# Patient Record
Sex: Male | Born: 1950
Health system: Southern US, Community
[De-identification: ages and names within clinical notes are randomized; demographics above are authoritative.]

## PROBLEM LIST (undated history)

## (undated) DIAGNOSIS — E785 Hyperlipidemia, unspecified: Secondary | ICD-10-CM

## (undated) DIAGNOSIS — K219 Gastro-esophageal reflux disease without esophagitis: Secondary | ICD-10-CM

## (undated) DIAGNOSIS — E119 Type 2 diabetes mellitus without complications: Secondary | ICD-10-CM

## (undated) DIAGNOSIS — G8929 Other chronic pain: Secondary | ICD-10-CM

## (undated) DIAGNOSIS — M48061 Spinal stenosis, lumbar region without neurogenic claudication: Secondary | ICD-10-CM

## (undated) DIAGNOSIS — I10 Essential (primary) hypertension: Secondary | ICD-10-CM

## (undated) DIAGNOSIS — M549 Dorsalgia, unspecified: Secondary | ICD-10-CM

## (undated) DIAGNOSIS — Z973 Presence of spectacles and contact lenses: Secondary | ICD-10-CM

## (undated) DIAGNOSIS — N433 Hydrocele, unspecified: Secondary | ICD-10-CM

## (undated) HISTORY — PX: EYE SURGERY: SHX253

## (undated) HISTORY — PX: BACK SURGERY: SHX140

---

## 1992-12-14 HISTORY — PX: CERVICAL FUSION: SHX112

## 2002-12-14 HISTORY — PX: WRIST GANGLION EXCISION: SUR520

## 2005-01-19 ENCOUNTER — Ambulatory Visit: Payer: Self-pay | Admitting: Family Medicine

## 2005-01-28 ENCOUNTER — Ambulatory Visit: Payer: Self-pay | Admitting: Family Medicine

## 2005-06-26 ENCOUNTER — Ambulatory Visit: Payer: Self-pay | Admitting: Family Medicine

## 2005-07-31 ENCOUNTER — Ambulatory Visit: Payer: Self-pay | Admitting: Family Medicine

## 2005-10-30 ENCOUNTER — Ambulatory Visit: Payer: Self-pay | Admitting: Family Medicine

## 2013-11-20 ENCOUNTER — Other Ambulatory Visit: Payer: Self-pay | Admitting: Urology

## 2013-11-30 ENCOUNTER — Encounter (HOSPITAL_BASED_OUTPATIENT_CLINIC_OR_DEPARTMENT_OTHER): Payer: Self-pay | Admitting: *Deleted

## 2013-12-04 ENCOUNTER — Encounter (HOSPITAL_BASED_OUTPATIENT_CLINIC_OR_DEPARTMENT_OTHER): Payer: Self-pay | Admitting: *Deleted

## 2013-12-04 NOTE — Progress Notes (Signed)
NPO AFTER MN.  ARRIVE AT 0830.  NEEDS ISTAT AND EKG.   

## 2013-12-04 NOTE — Progress Notes (Signed)
12/04/13 0941  OBSTRUCTIVE SLEEP APNEA  Have you ever been diagnosed with sleep apnea through a sleep study? No  Do you snore loudly (loud enough to be heard through closed doors)?  1  Do you often feel tired, fatigued, or sleepy during the daytime? 0  Has anyone observed you stop breathing during your sleep? 0  Do you have, or are you being treated for high blood pressure? 1  BMI more than 35 kg/m2? 0  Age over 64 years old? 1  Neck circumference greater than 40 cm/18 inches? 0  Gender: 1  Obstructive Sleep Apnea Score 4  Score 4 or greater  Results sent to PCP

## 2013-12-11 ENCOUNTER — Encounter (HOSPITAL_BASED_OUTPATIENT_CLINIC_OR_DEPARTMENT_OTHER)
Admission: RE | Disposition: A | Discharge status: Home / Self Care | Payer: Self-pay | Source: Ambulatory Visit | Attending: Urology

## 2013-12-11 ENCOUNTER — Encounter (HOSPITAL_BASED_OUTPATIENT_CLINIC_OR_DEPARTMENT_OTHER): Payer: Self-pay | Admitting: Anesthesiology

## 2013-12-11 ENCOUNTER — Encounter (HOSPITAL_BASED_OUTPATIENT_CLINIC_OR_DEPARTMENT_OTHER): Payer: BC Managed Care – PPO | Admitting: Anesthesiology

## 2013-12-11 ENCOUNTER — Other Ambulatory Visit: Payer: Self-pay

## 2013-12-11 ENCOUNTER — Ambulatory Visit (HOSPITAL_BASED_OUTPATIENT_CLINIC_OR_DEPARTMENT_OTHER): Payer: BC Managed Care – PPO | Admitting: Anesthesiology

## 2013-12-11 ENCOUNTER — Ambulatory Visit (HOSPITAL_BASED_OUTPATIENT_CLINIC_OR_DEPARTMENT_OTHER)
Admission: RE | Admit: 2013-12-11 | Discharge: 2013-12-11 | Disposition: A | Discharge status: Home / Self Care | Payer: BC Managed Care – PPO | Source: Ambulatory Visit | Attending: Urology | Admitting: Urology

## 2013-12-11 DIAGNOSIS — E7800 Pure hypercholesterolemia, unspecified: Secondary | ICD-10-CM | POA: Insufficient documentation

## 2013-12-11 DIAGNOSIS — E669 Obesity, unspecified: Secondary | ICD-10-CM | POA: Insufficient documentation

## 2013-12-11 DIAGNOSIS — K219 Gastro-esophageal reflux disease without esophagitis: Secondary | ICD-10-CM | POA: Insufficient documentation

## 2013-12-11 DIAGNOSIS — Z87891 Personal history of nicotine dependence: Secondary | ICD-10-CM | POA: Insufficient documentation

## 2013-12-11 DIAGNOSIS — N433 Hydrocele, unspecified: Secondary | ICD-10-CM | POA: Diagnosis present

## 2013-12-11 DIAGNOSIS — Z79899 Other long term (current) drug therapy: Secondary | ICD-10-CM | POA: Insufficient documentation

## 2013-12-11 DIAGNOSIS — I10 Essential (primary) hypertension: Secondary | ICD-10-CM | POA: Insufficient documentation

## 2013-12-11 HISTORY — DX: Hydrocele, unspecified: N43.3

## 2013-12-11 HISTORY — DX: Other chronic pain: G89.29

## 2013-12-11 HISTORY — DX: Hyperlipidemia, unspecified: E78.5

## 2013-12-11 HISTORY — DX: Gastro-esophageal reflux disease without esophagitis: K21.9

## 2013-12-11 HISTORY — DX: Dorsalgia, unspecified: M54.9

## 2013-12-11 HISTORY — DX: Essential (primary) hypertension: I10

## 2013-12-11 HISTORY — DX: Spinal stenosis, lumbar region without neurogenic claudication: M48.061

## 2013-12-11 HISTORY — PX: HYDROCELE EXCISION: SHX482

## 2013-12-11 HISTORY — DX: Presence of spectacles and contact lenses: Z97.3

## 2013-12-11 LAB — POCT I-STAT 4, (NA,K, GLUC, HGB,HCT)
Glucose, Bld: 120 mg/dL — ABNORMAL HIGH (ref 70–99)
HCT: 49 % (ref 39.0–52.0)
Hemoglobin: 16.7 g/dL (ref 13.0–17.0)
Potassium: 4.3 mEq/L (ref 3.5–5.1)
Sodium: 140 mEq/L (ref 135–145)

## 2013-12-11 SURGERY — HYDROCELECTOMY
Anesthesia: General | Site: Scrotum | Laterality: Left

## 2013-12-11 MED ORDER — LIDOCAINE HCL (CARDIAC) 20 MG/ML IV SOLN
INTRAVENOUS | Status: DC | PRN
  Administered 2013-12-11: 60 mg via INTRAVENOUS

## 2013-12-11 MED ORDER — OXYCODONE-ACETAMINOPHEN 5-325 MG PO TABS
ORAL_TABLET | ORAL | Status: AC
  Filled 2013-12-11: qty 1

## 2013-12-11 MED ORDER — CEFAZOLIN SODIUM-DEXTROSE 2-3 GM-% IV SOLR
2.0000 g | INTRAVENOUS | Status: AC
  Administered 2013-12-11: 2 g via INTRAVENOUS
  Filled 2013-12-11: qty 50

## 2013-12-11 MED ORDER — PROMETHAZINE HCL 25 MG/ML IJ SOLN
6.2500 mg | INTRAMUSCULAR | Status: DC | PRN
  Filled 2013-12-11: qty 1

## 2013-12-11 MED ORDER — DEXAMETHASONE SODIUM PHOSPHATE 4 MG/ML IJ SOLN
INTRAMUSCULAR | Status: DC | PRN
  Administered 2013-12-11: 10 mg via INTRAVENOUS

## 2013-12-11 MED ORDER — EPHEDRINE SULFATE 50 MG/ML IJ SOLN
INTRAMUSCULAR | Status: DC | PRN
  Administered 2013-12-11 (×2): 10 mg via INTRAVENOUS

## 2013-12-11 MED ORDER — FENTANYL CITRATE 0.05 MG/ML IJ SOLN
INTRAMUSCULAR | Status: DC | PRN
  Administered 2013-12-11 (×2): 50 ug via INTRAVENOUS

## 2013-12-11 MED ORDER — MIDAZOLAM HCL 5 MG/5ML IJ SOLN
INTRAMUSCULAR | Status: DC | PRN
  Administered 2013-12-11: 2 mg via INTRAVENOUS

## 2013-12-11 MED ORDER — MIDAZOLAM HCL 2 MG/2ML IJ SOLN
INTRAMUSCULAR | Status: AC
  Filled 2013-12-11: qty 2

## 2013-12-11 MED ORDER — LACTATED RINGERS IV SOLN
INTRAVENOUS | Status: DC
  Administered 2013-12-11 (×2): via INTRAVENOUS
  Filled 2013-12-11: qty 1000

## 2013-12-11 MED ORDER — OXYCODONE-ACETAMINOPHEN 5-325 MG PO TABS
1.0000 | ORAL_TABLET | ORAL | Status: DC | PRN
  Administered 2013-12-11: 1 via ORAL
  Filled 2013-12-11: qty 1

## 2013-12-11 MED ORDER — FENTANYL CITRATE 0.05 MG/ML IJ SOLN
25.0000 ug | INTRAMUSCULAR | Status: DC | PRN
  Filled 2013-12-11: qty 1

## 2013-12-11 MED ORDER — FENTANYL CITRATE 0.05 MG/ML IJ SOLN
INTRAMUSCULAR | Status: AC
  Filled 2013-12-11: qty 4

## 2013-12-11 MED ORDER — BUPIVACAINE-EPINEPHRINE 0.5% -1:200000 IJ SOLN
INTRAMUSCULAR | Status: DC | PRN
  Administered 2013-12-11: 18 mL

## 2013-12-11 MED ORDER — PROPOFOL 10 MG/ML IV BOLUS
INTRAVENOUS | Status: DC | PRN
  Administered 2013-12-11: 200 mg via INTRAVENOUS

## 2013-12-11 MED ORDER — ONDANSETRON HCL 4 MG/2ML IJ SOLN
INTRAMUSCULAR | Status: DC | PRN
  Administered 2013-12-11: 4 mg via INTRAVENOUS

## 2013-12-11 MED ORDER — OXYCODONE-ACETAMINOPHEN 10-325 MG PO TABS: 1.0000 | ORAL_TABLET | ORAL | Status: DC | PRN

## 2013-12-11 SURGICAL SUPPLY — 29 items
BANDAGE GAUZE ELAST BULKY 4 IN (GAUZE/BANDAGES/DRESSINGS) ×2 IMPLANT
BLADE SURG 15 STRL LF DISP TIS (BLADE) ×1 IMPLANT
BLADE SURG 15 STRL SS (BLADE) ×1
BLADE SURG ROTATE 9660 (MISCELLANEOUS) ×2 IMPLANT
BNDG GAUZE ELAST 4 BULKY (GAUZE/BANDAGES/DRESSINGS) ×2 IMPLANT
CANISTER SUCTION 2500CC (MISCELLANEOUS) ×2 IMPLANT
CLOTH BEACON ORANGE TIMEOUT ST (SAFETY) ×2 IMPLANT
COVER MAYO STAND STRL (DRAPES) ×2 IMPLANT
COVER TABLE BACK 60X90 (DRAPES) ×2 IMPLANT
DRAPE PED LAPAROTOMY (DRAPES) ×2 IMPLANT
ELECT REM PT RETURN 9FT ADLT (ELECTROSURGICAL) ×2
ELECTRODE REM PT RTRN 9FT ADLT (ELECTROSURGICAL) ×1 IMPLANT
GLOVE BIO SURGEON STRL SZ 6.5 (GLOVE) ×2 IMPLANT
GLOVE BIO SURGEON STRL SZ8 (GLOVE) ×2 IMPLANT
GLOVE INDICATOR 6.5 STRL GRN (GLOVE) ×4 IMPLANT
GOWN PREVENTION PLUS LG XLONG (DISPOSABLE) ×2 IMPLANT
GOWN STRL REIN XL XLG (GOWN DISPOSABLE) ×2 IMPLANT
NEEDLE HYPO 25X1 1.5 SAFETY (NEEDLE) ×2 IMPLANT
NS IRRIG 500ML POUR BTL (IV SOLUTION) ×4 IMPLANT
PACK BASIN DAY SURGERY FS (CUSTOM PROCEDURE TRAY) ×2 IMPLANT
PENCIL BUTTON HOLSTER BLD 10FT (ELECTRODE) ×2 IMPLANT
SPONGE GAUZE 4X4 12PLY (GAUZE/BANDAGES/DRESSINGS) ×2 IMPLANT
SUPPORT SCROTAL LG STRP (MISCELLANEOUS) ×2 IMPLANT
SUT CHROMIC 3 0 SH 27 (SUTURE) ×6 IMPLANT
SYR CONTROL 10ML LL (SYRINGE) ×2 IMPLANT
TOWEL OR 17X24 6PK STRL BLUE (TOWEL DISPOSABLE) ×2 IMPLANT
TRAY DSU PREP LF (CUSTOM PROCEDURE TRAY) ×2 IMPLANT
TUBE CONNECTING 12X1/4 (SUCTIONS) ×2 IMPLANT
YANKAUER SUCT BULB TIP NO VENT (SUCTIONS) ×2 IMPLANT

## 2013-12-11 NOTE — H&P (Signed)
History of Present Illness Left hydrocele: For about 6 months now he's had progressive swelling of his left hemiscrotum. 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 He denies any scrotal/testicular injury, history of infection or prior inguinal surgery. He underwent a scrotal ultrasound on 11/06/13 which revealed a large left hydrocele but both testicles and epididymis were noted to be normal. He saw a urologist who recommended surgery. His primary care physician told him that it could be aspirated. He has come to see me regarding this and it is of moderate severity with no modifying factors or other associated signs and symptoms.   Past Medical History Problems  1. History of hypercholesterolemia (V12.29) 2. History of hypertension (V12.59) 3. History of Reflux (530.81)  Surgical History Problems  1. History of Back Surgery  Current Meds 1. Advil TABS;  Therapy: (Recorded:05Dec2014) to Recorded 2. Antacid CHEW;  Therapy: (Recorded:05Dec2014) to Recorded 3. Atorvastatin Calcium 40 MG Oral Tablet;  Therapy: (Recorded:05Dec2014) to Recorded 4. Benadryl Allergy SYRP;  Therapy: (Recorded:05Dec2014) to Recorded 5. Lisinopril 10 MG Oral Tablet;  Therapy: (Recorded:05Dec2014) to Recorded  Allergies Medication  1. Bactrim TABS 2. Doxycycline Hyclate CAPS 3. Vancomycin HCl SOLR  Family History Problems  1. 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  Genitourinary: nocturia and scrotal swelling.  Musculoskeletal: back pain.     Vitals Vital Signs Height: 6 ft  Weight: 230 lb  BMI Calculated: 31.19 BSA Calculated: 2.26 Blood Pressure: 126 / 73 Heart Rate: 65  Physical Exam Constitutional: Well nourished and well developed . No acute distress.  ENT:. The ears and nose are normal in appearance.  Neck: The appearance of the neck is normal and no neck mass is present.  Pulmonary: No respiratory distress and normal respiratory rhythm and effort.  Cardiovascular: Heart rate and rhythm are normal . No peripheral edema.  Abdomen: The abdomen is soft and nontender. No masses are palpated. No CVA tenderness. No hernias are palpable. No hepatosplenomegaly noted.  Genitourinary: Examination of the penis demonstrates no discharge, no masses, no lesions and a normal meatus. The scrotum is without lesions. Examination of the left scrotum demostrates a hydrocele. The right epididymis is palpably normal and non-tender. The left epididymis is palpably normal and non-tender. The right testis is non-tender and without masses. The left testis is nonpalpable.  Lymphatics: The femoral and inguinal nodes are not enlarged or tender.  Skin: Normal skin turgor, no visible rash and no visible skin lesions.  Neuro/Psych:. Mood and affect are appropriate.    Results/Data Urine COLOR YELLOW   APPEARANCE CLEAR   SPECIFIC GRAVITY 1.025   pH 6.0   GLUCOSE NEG mg/dL  BILIRUBIN NEG   KETONE NEG mg/dL  BLOOD NEG   PROTEIN NEG mg/dL  UROBILINOGEN 0.2 mg/dL  NITRITE NEG   LEUKOCYTE ESTERASE NEG    Assessment   He has a moderately large left hydrocele. We therefore discussed the treatment options. I told him that it certainly could be aspirated but this is not a permanent solution as the fluid would almost certainly recur. I therefore have discussed surgical management with a hydrocelectomy.  I went over the procedure with him in detail. We discussed the incision used, the risks and complications, the outpatient nature of the procedure, the  probability of success and the anticipated postoperative course. He understands, has had all of his questions answered to his satisfaction and has elected to proceed.   Plan   He is scheduled for a left hydrocelectomy.

## 2013-12-11 NOTE — Anesthesia Postprocedure Evaluation (Signed)
  Anesthesia Post-op Note  Patient: Xavier Willis  Procedure(s) Performed: Procedure(s) (LRB): LEFT HYDROCELECTOMY ADULT (Left)  Patient Location: PACU  Anesthesia Type: General  Level of Consciousness: awake and alert   Airway and Oxygen Therapy: Patient Spontanous Breathing  Post-op Pain: mild  Post-op Assessment: Post-op Vital signs reviewed, Patient's Cardiovascular Status Stable, Respiratory Function Stable, Patent Airway and No signs of Nausea or vomiting  Last Vitals:  Filed Vitals:   12/11/13 1145  BP: 123/77  Pulse: 88  Temp:   Resp: 18    Post-op Vital Signs: stable   Complications: No apparent anesthesia complications

## 2013-12-11 NOTE — Transfer of Care (Signed)
Immediate Anesthesia Transfer of Care Note  Patient: Xavier Willis  Procedure(s) Performed: Procedure(s) (LRB): LEFT HYDROCELECTOMY ADULT (Left)  Patient Location: PACU  Anesthesia Type: General  Level of Consciousness: awake, alert  and oriented  Airway & Oxygen Therapy: Patient Spontanous Breathing and Patient connected to face mask oxygen  Post-op Assessment: Report given to PACU RN and Post -op Vital signs reviewed and stable  Post vital signs: Reviewed and stable  Complications: No apparent anesthesia complications

## 2013-12-11 NOTE — Anesthesia Preprocedure Evaluation (Addendum)
Anesthesia Evaluation  Patient identified by MRN, date of birth, ID band Patient awake    Reviewed: Allergy & Precautions, H&P , NPO status , Patient's Chart, lab work & pertinent test results  Airway Mallampati: II TM Distance: >3 FB Neck ROM: Full   Comment: Some decreased neck extension after neck fusion. Dental no notable dental hx.    Pulmonary neg pulmonary ROS,  breath sounds clear to auscultation  Pulmonary exam normal       Cardiovascular Exercise Tolerance: Good hypertension, Pt. on medications Rhythm:Regular Rate:Normal  ECG: Normal.   Neuro/Psych negative neurological ROS  negative psych ROS   GI/Hepatic Neg liver ROS, GERD-  Medicated,  Endo/Other  negative endocrine ROS  Renal/GU negative Renal ROS  negative genitourinary   Musculoskeletal negative musculoskeletal ROS (+)   Abdominal (+) + obese,   Peds negative pediatric ROS (+)  Hematology negative hematology ROS (+)   Anesthesia Other Findings   Reproductive/Obstetrics negative OB ROS                         Anesthesia Physical Anesthesia Plan  ASA: II  Anesthesia Plan: General   Post-op Pain Management:    Induction: Intravenous  Airway Management Planned: LMA  Additional Equipment:   Intra-op Plan:   Post-operative Plan: Extubation in OR  Informed Consent: I have reviewed the patients History and Physical, chart, labs and discussed the procedure including the risks, benefits and alternatives for the proposed anesthesia with the patient or authorized representative who has indicated his/her understanding and acceptance.   Dental advisory given  Plan Discussed with: CRNA  Anesthesia Plan Comments:         Anesthesia Quick Evaluation

## 2013-12-11 NOTE — Op Note (Signed)
PATIENT:  Xavier Willis  PRE-OPERATIVE DIAGNOSIS: Left hydrocele  POST-OPERATIVE DIAGNOSIS:  Same  PROCEDURE:  Procedure(s): Left hydrocelectomy  SURGEON:  Garnett Farm  INDICATION: Mr. Xavier Willis is a 62 year old male who has been experiencing progressive swelling of his left hemiscrotum over the past 6 months. 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 He's not had any injury or history of infection. He's had no prior inguinal surgery. A scrotal ultrasound in 11/14 revealed a large left hydrocele with a normal testicle. There was an indication that there was a very small right hydrocele present as well. This was not clinically evident. We discussed the treatment options and elected to proceed with surgical therapy.  ANESTHESIA:  General  EBL:  Minimal  DRAINS: None  LOCAL MEDICATIONS USED:  1/2% Marcaine with epinephrine 18 cc  SPECIMEN:  None  DISPOSITION OF SPECIMEN:  N/A  Description of procedure: After informed consent the patient was brought to the major OR, placed on the table and administered general anesthesia. His genitalia was then sterilely prepped and draped. An official timeout was then performed.  I injected Marcaine with epinephrine in the midline and allowed adequate time for epinephrine effect. A midline median raphae scrotal incision was then made and carried down over the left hydrocele. The tissue over the hydrocele was cleared using a combination of sharp and blunt technique. The hydrocele was then opened, drained of clear amber fluid and delivered through the incision. The excess hydrocele sac tissue was excised with the Bovie electrocautery and then I cauterized the edges. I then reapproximated the edges posteriorly behind the epididymis with a running, locking 3-0 chromic suture. The appendix testis was removed with the Bovie.   His testicle was then replaced in the normal anatomic position and his left hemiscrotum. I evaluated the  right testicle through the incision and found essentially no hydrocele fluid surrounding the testicle. I then closed the deep scrotal tissue with running 3-0 chromic suture in a locking fashion. I closed the skin with running 3-0 chromic. Neosporin, a sterile gauze dressing, fluff Kerlix and a scrotal support were applied. The patient tolerated the procedure well no intraoperative complications. Needle sponge and instrument counts were correct at the end of the operation.   PLAN OF CARE: Discharge to home after PACU  PATIENT DISPOSITION:  PACU - hemodynamically stable.

## 2013-12-12 ENCOUNTER — Encounter (HOSPITAL_BASED_OUTPATIENT_CLINIC_OR_DEPARTMENT_OTHER): Payer: Self-pay | Admitting: Urology

## 2018-03-16 DIAGNOSIS — R351 Nocturia: Secondary | ICD-10-CM | POA: Diagnosis not present

## 2018-03-16 DIAGNOSIS — N401 Enlarged prostate with lower urinary tract symptoms: Secondary | ICD-10-CM | POA: Diagnosis not present

## 2018-03-16 DIAGNOSIS — Z125 Encounter for screening for malignant neoplasm of prostate: Secondary | ICD-10-CM | POA: Diagnosis not present

## 2018-07-13 DIAGNOSIS — I10 Essential (primary) hypertension: Secondary | ICD-10-CM | POA: Diagnosis not present

## 2018-07-13 DIAGNOSIS — E8881 Metabolic syndrome: Secondary | ICD-10-CM | POA: Diagnosis not present

## 2018-09-15 DIAGNOSIS — L703 Acne tropica: Secondary | ICD-10-CM | POA: Diagnosis not present

## 2018-11-14 DIAGNOSIS — L299 Pruritus, unspecified: Secondary | ICD-10-CM | POA: Diagnosis not present

## 2018-11-14 DIAGNOSIS — L703 Acne tropica: Secondary | ICD-10-CM | POA: Diagnosis not present

## 2018-11-14 DIAGNOSIS — L209 Atopic dermatitis, unspecified: Secondary | ICD-10-CM | POA: Diagnosis not present

## 2018-11-14 DIAGNOSIS — C4441 Basal cell carcinoma of skin of scalp and neck: Secondary | ICD-10-CM | POA: Diagnosis not present

## 2018-12-13 DIAGNOSIS — R05 Cough: Secondary | ICD-10-CM | POA: Diagnosis not present

## 2018-12-13 DIAGNOSIS — J069 Acute upper respiratory infection, unspecified: Secondary | ICD-10-CM | POA: Diagnosis not present

## 2018-12-16 DIAGNOSIS — J329 Chronic sinusitis, unspecified: Secondary | ICD-10-CM | POA: Diagnosis not present

## 2018-12-16 DIAGNOSIS — Z6831 Body mass index (BMI) 31.0-31.9, adult: Secondary | ICD-10-CM | POA: Diagnosis not present

## 2019-01-27 DIAGNOSIS — Z6831 Body mass index (BMI) 31.0-31.9, adult: Secondary | ICD-10-CM | POA: Diagnosis not present

## 2019-01-27 DIAGNOSIS — Z0000 Encounter for general adult medical examination without abnormal findings: Secondary | ICD-10-CM | POA: Diagnosis not present

## 2019-01-27 DIAGNOSIS — Z79899 Other long term (current) drug therapy: Secondary | ICD-10-CM | POA: Diagnosis not present

## 2019-01-27 DIAGNOSIS — E785 Hyperlipidemia, unspecified: Secondary | ICD-10-CM | POA: Diagnosis not present

## 2019-01-27 DIAGNOSIS — R7309 Other abnormal glucose: Secondary | ICD-10-CM | POA: Diagnosis not present

## 2019-01-27 DIAGNOSIS — Z1339 Encounter for screening examination for other mental health and behavioral disorders: Secondary | ICD-10-CM | POA: Diagnosis not present

## 2019-01-27 DIAGNOSIS — Z1331 Encounter for screening for depression: Secondary | ICD-10-CM | POA: Diagnosis not present

## 2019-01-27 DIAGNOSIS — N400 Enlarged prostate without lower urinary tract symptoms: Secondary | ICD-10-CM | POA: Diagnosis not present

## 2019-01-27 DIAGNOSIS — I10 Essential (primary) hypertension: Secondary | ICD-10-CM | POA: Diagnosis not present

## 2019-01-27 DIAGNOSIS — Z2821 Immunization not carried out because of patient refusal: Secondary | ICD-10-CM | POA: Diagnosis not present

## 2019-05-05 DIAGNOSIS — L820 Inflamed seborrheic keratosis: Secondary | ICD-10-CM | POA: Diagnosis not present

## 2019-05-16 DIAGNOSIS — E1165 Type 2 diabetes mellitus with hyperglycemia: Secondary | ICD-10-CM | POA: Diagnosis not present

## 2019-05-16 DIAGNOSIS — Z6831 Body mass index (BMI) 31.0-31.9, adult: Secondary | ICD-10-CM | POA: Diagnosis not present

## 2019-07-20 DIAGNOSIS — R1900 Intra-abdominal and pelvic swelling, mass and lump, unspecified site: Secondary | ICD-10-CM | POA: Diagnosis not present

## 2019-07-20 DIAGNOSIS — Z6831 Body mass index (BMI) 31.0-31.9, adult: Secondary | ICD-10-CM | POA: Diagnosis not present

## 2019-07-20 DIAGNOSIS — R200 Anesthesia of skin: Secondary | ICD-10-CM | POA: Diagnosis not present

## 2019-08-07 DIAGNOSIS — R200 Anesthesia of skin: Secondary | ICD-10-CM | POA: Diagnosis not present

## 2019-08-07 DIAGNOSIS — M545 Low back pain: Secondary | ICD-10-CM | POA: Diagnosis not present

## 2019-08-18 DIAGNOSIS — R1900 Intra-abdominal and pelvic swelling, mass and lump, unspecified site: Secondary | ICD-10-CM | POA: Diagnosis not present

## 2019-08-24 DIAGNOSIS — M545 Low back pain: Secondary | ICD-10-CM | POA: Diagnosis not present

## 2019-08-24 DIAGNOSIS — M48062 Spinal stenosis, lumbar region with neurogenic claudication: Secondary | ICD-10-CM | POA: Diagnosis not present

## 2019-08-24 DIAGNOSIS — M4726 Other spondylosis with radiculopathy, lumbar region: Secondary | ICD-10-CM | POA: Diagnosis not present

## 2019-08-24 DIAGNOSIS — M5116 Intervertebral disc disorders with radiculopathy, lumbar region: Secondary | ICD-10-CM | POA: Diagnosis not present

## 2019-08-24 DIAGNOSIS — M961 Postlaminectomy syndrome, not elsewhere classified: Secondary | ICD-10-CM | POA: Diagnosis not present

## 2019-09-26 DIAGNOSIS — M5126 Other intervertebral disc displacement, lumbar region: Secondary | ICD-10-CM | POA: Diagnosis not present

## 2019-09-26 DIAGNOSIS — M48062 Spinal stenosis, lumbar region with neurogenic claudication: Secondary | ICD-10-CM | POA: Diagnosis not present

## 2019-09-26 DIAGNOSIS — Z6825 Body mass index (BMI) 25.0-25.9, adult: Secondary | ICD-10-CM | POA: Diagnosis not present

## 2019-09-26 DIAGNOSIS — R030 Elevated blood-pressure reading, without diagnosis of hypertension: Secondary | ICD-10-CM | POA: Diagnosis not present

## 2019-10-09 DIAGNOSIS — M48061 Spinal stenosis, lumbar region without neurogenic claudication: Secondary | ICD-10-CM | POA: Diagnosis not present

## 2019-10-09 DIAGNOSIS — M5126 Other intervertebral disc displacement, lumbar region: Secondary | ICD-10-CM | POA: Diagnosis not present

## 2019-10-09 DIAGNOSIS — M5116 Intervertebral disc disorders with radiculopathy, lumbar region: Secondary | ICD-10-CM | POA: Diagnosis not present

## 2019-10-09 DIAGNOSIS — M5125 Other intervertebral disc displacement, thoracolumbar region: Secondary | ICD-10-CM | POA: Diagnosis not present

## 2019-10-10 ENCOUNTER — Other Ambulatory Visit: Payer: Self-pay | Admitting: Neurosurgery

## 2019-10-10 DIAGNOSIS — I10 Essential (primary) hypertension: Secondary | ICD-10-CM | POA: Diagnosis not present

## 2019-10-10 DIAGNOSIS — Z6825 Body mass index (BMI) 25.0-25.9, adult: Secondary | ICD-10-CM | POA: Diagnosis not present

## 2019-10-10 DIAGNOSIS — M5126 Other intervertebral disc displacement, lumbar region: Secondary | ICD-10-CM | POA: Diagnosis not present

## 2019-10-20 NOTE — Progress Notes (Signed)
[facility] [id number], [address] AT [place] [id number] [place] [address] Phone: [phone] Fax: [phone]      Your procedure is scheduled on Wednesday 10/25/2019.  Report to [facility] Main Entrance "A" at 06:30 A.M., and check in at the Admitting office.  Call this number if you have problems the morning of surgery:  [phone]  Call [phone] if you have any questions prior to your surgery date Monday-Friday 8am-4pm    Remember:  Do not eat or drink after midnight the night before your surgery     Take these medicines the morning of surgery with A SIP OF WATER: Fexofenadine (Allegra) Tamsulosin (Flomax)  7 days prior to surgery STOP taking any Meloxicam (Mobic); Aspirin (unless otherwise instructed by your surgeon) or Aspirin containing products, Aleve, Naproxen, Ibuprofen, Motrin, Advil, Goody's, BC's, all herbal medications, fish oil, and all vitamins.   WHAT DO I DO ABOUT MY DIABETES MEDICATION?  Marland Kitchen Do not take oral diabetes medicines (pills) the morning of surgery. - Do not take Metformin (Glucophage) the morning of surgery    How to Manage Your Diabetes Before and After Surgery  Why is it important to control my blood sugar before and after surgery? . Improving blood sugar levels before and after surgery helps healing and can limit problems. . A way of improving blood sugar control is eating a healthy diet by: o  Eating less sugar and carbohydrates o  Increasing activity/exercise o  Talking with your doctor about reaching your blood sugar goals . High blood sugars (greater than 180 mg/dL) can raise your risk of infections and slow your recovery, so you will need to focus on controlling your diabetes during the weeks before surgery. . Make sure that the doctor who takes care of your diabetes knows about your planned surgery including the date and location.  How do I manage my blood sugar  before surgery? . Check your blood sugar at least 4 times a day, starting 2 days before surgery, to make sure that the level is not too high or low. . Check your blood sugar the morning of your surgery when you wake up and every 2 hours until you get to the Short Stay unit. o If your blood sugar is less than 70 mg/dL, you will need to treat for low blood sugar: o Do not eat anything. o Do not take insulin. o Treat a low blood sugar (less than 70 mg/dL) with  cup of clear juice (cranberry or apple), 4 glucose tablets, OR glucose gel. o Recheck blood sugar in 15 minutes after treatment (to make sure it is greater than 70 mg/dL). If your blood sugar is not greater than 70 mg/dL on recheck, call [phone] for further instructions. . Report your blood sugar to the short stay nurse when you get to Short Stay.  . If you are admitted to the hospital after surgery: o Your blood sugar will be checked by the staff and you will probably be given insulin after surgery (instead of oral diabetes medicines) to make sure you have good blood sugar levels. o The goal for blood sugar control after surgery is 80-180 mg/dL.     The Morning of Surgery  Do not wear jewelry, make-up or nail polish.  Do not wear lotions, powders, or perfumes/colognes, or deodorant  Do not shave 48 hours prior to surgery.  Men may shave face and neck.  Do not bring valuables to the hospital.  [facility] is not responsible for any belongings or valuables.  If you are a smoker, DO NOT Smoke 24 hours prior to surgery  If you wear a CPAP at night please bring your mask, tubing, and machine the morning of surgery   Remember that you must have someone to transport you home after your surgery, and remain with you for 24 hours if you are discharged the same day.   Contacts, glasses, hearing aids, dentures or bridgework may not be worn into surgery.    Leave your suitcase in the car.  After surgery it may be brought to your  room.  For patients admitted to the hospital, discharge time will be determined by your treatment team.  Patients discharged the day of surgery will not be allowed to drive home.    Special instructions:   [place]- Preparing For Surgery  Before surgery, you can play an important role. Because skin is not sterile, your skin needs to be as free of germs as possible. You can reduce the number of germs on your skin by washing with CHG (chlorahexidine gluconate) Soap before surgery.  CHG is an antiseptic cleaner which kills germs and bonds with the skin to continue killing germs even after washing.    Oral Hygiene is also important to reduce your risk of infection.  Remember - BRUSH YOUR TEETH THE MORNING OF SURGERY WITH YOUR REGULAR TOOTHPASTE  Please do not use if you have an allergy to CHG or antibacterial soaps. If your skin becomes reddened/irritated stop using the CHG.  Do not shave (including legs and underarms) for at least 48 hours prior to first CHG shower. It is OK to shave your face.  Please follow these instructions carefully.   1. Shower the NIGHT BEFORE SURGERY and the MORNING OF SURGERY with CHG Soap.   2. If you chose to wash your hair, wash your hair first as usual with your normal shampoo.  3. After you shampoo, rinse your hair and body thoroughly to remove the shampoo.  4. Use CHG as you would any other liquid soap. You can apply CHG directly to the skin and wash gently with a scrungie or a clean washcloth.   5. Apply the CHG Soap to your body ONLY FROM THE NECK DOWN.  Do not use on open wounds or open sores. Avoid contact with your eyes, ears, mouth and genitals (private parts). Wash Face and genitals (private parts)  with your normal soap.   6. Wash thoroughly, paying special attention to the area where your surgery will be performed.  7. Thoroughly rinse your body with warm water from the neck down.  8. DO NOT shower/wash with your normal soap after using and  rinsing off the CHG Soap.  9. Pat yourself dry with a CLEAN TOWEL.  10. Wear CLEAN PAJAMAS to bed the night before surgery, wear comfortable clothes the morning of surgery  11. Place CLEAN SHEETS on your bed the night of your first shower and DO NOT SLEEP WITH PETS.    Day of Surgery:  Please shower the morning of surgery with the CHG soap Do not apply any deodorants/lotions.  Please wear clean clothes to the hospital/surgery center.   Remember to brush your teeth WITH YOUR REGULAR TOOTHPASTE.   Please read over the following fact sheets that you were given.

## 2019-10-23 ENCOUNTER — Encounter (HOSPITAL_COMMUNITY): Payer: Self-pay

## 2019-10-23 ENCOUNTER — Other Ambulatory Visit: Payer: Self-pay

## 2019-10-23 ENCOUNTER — Encounter (HOSPITAL_COMMUNITY)
Admission: RE | Admit: 2019-10-23 | Discharge: 2019-10-23 | Disposition: A | Discharge status: Home / Self Care | Payer: PPO | Source: Ambulatory Visit | Attending: Neurosurgery | Admitting: Neurosurgery

## 2019-10-23 ENCOUNTER — Other Ambulatory Visit (HOSPITAL_COMMUNITY)
Admission: RE | Admit: 2019-10-23 | Discharge: 2019-10-23 | Disposition: A | Discharge status: Home / Self Care | Payer: PPO | Source: Ambulatory Visit | Attending: Neurosurgery | Admitting: Neurosurgery

## 2019-10-23 DIAGNOSIS — M5126 Other intervertebral disc displacement, lumbar region: Secondary | ICD-10-CM | POA: Diagnosis not present

## 2019-10-23 DIAGNOSIS — M549 Dorsalgia, unspecified: Secondary | ICD-10-CM | POA: Diagnosis not present

## 2019-10-23 DIAGNOSIS — G8929 Other chronic pain: Secondary | ICD-10-CM | POA: Insufficient documentation

## 2019-10-23 DIAGNOSIS — I10 Essential (primary) hypertension: Secondary | ICD-10-CM | POA: Diagnosis not present

## 2019-10-23 DIAGNOSIS — Z981 Arthrodesis status: Secondary | ICD-10-CM | POA: Diagnosis not present

## 2019-10-23 DIAGNOSIS — E119 Type 2 diabetes mellitus without complications: Secondary | ICD-10-CM | POA: Diagnosis not present

## 2019-10-23 DIAGNOSIS — K219 Gastro-esophageal reflux disease without esophagitis: Secondary | ICD-10-CM | POA: Diagnosis not present

## 2019-10-23 DIAGNOSIS — Z7984 Long term (current) use of oral hypoglycemic drugs: Secondary | ICD-10-CM | POA: Insufficient documentation

## 2019-10-23 DIAGNOSIS — Z79899 Other long term (current) drug therapy: Secondary | ICD-10-CM | POA: Insufficient documentation

## 2019-10-23 DIAGNOSIS — Z87891 Personal history of nicotine dependence: Secondary | ICD-10-CM | POA: Diagnosis not present

## 2019-10-23 DIAGNOSIS — Z01818 Encounter for other preprocedural examination: Secondary | ICD-10-CM | POA: Diagnosis not present

## 2019-10-23 HISTORY — DX: Type 2 diabetes mellitus without complications: E11.9

## 2019-10-23 LAB — CBC
HCT: 47.3 % (ref 39.0–52.0)
Hemoglobin: 16 g/dL (ref 13.0–17.0)
MCH: 31.4 pg (ref 26.0–34.0)
MCHC: 33.8 g/dL (ref 30.0–36.0)
MCV: 92.9 fL (ref 80.0–100.0)
Platelets: 198 10*3/uL (ref 150–400)
RBC: 5.09 MIL/uL (ref 4.22–5.81)
RDW: 13 % (ref 11.5–15.5)
WBC: 6.9 10*3/uL (ref 4.0–10.5)
nRBC: 0 % (ref 0.0–0.2)

## 2019-10-23 LAB — BASIC METABOLIC PANEL
Anion gap: 11 (ref 5–15)
BUN: 15 mg/dL (ref 8–23)
CO2: 24 mmol/L (ref 22–32)
Calcium: 9.4 mg/dL (ref 8.9–10.3)
Chloride: 100 mmol/L (ref 98–111)
Creatinine, Ser: 0.95 mg/dL (ref 0.61–1.24)
GFR calc Af Amer: >60 mL/min (ref >60)
GFR calc non Af Amer: >60 mL/min (ref >60)
Glucose, Bld: 293 mg/dL — ABNORMAL HIGH (ref 70–99)
Potassium: 5 mmol/L (ref 3.5–5.1)
Sodium: 135 mmol/L (ref 135–145)

## 2019-10-23 LAB — HEMOGLOBIN A1C
Hgb A1c MFr Bld: 8.2 % — ABNORMAL HIGH (ref 4.8–5.6)
Mean Plasma Glucose: 188.64 mg/dL

## 2019-10-23 LAB — SURGICAL PCR SCREEN
MRSA, PCR: NEGATIVE
Staphylococcus aureus: NEGATIVE

## 2019-10-23 LAB — GLUCOSE, CAPILLARY: Glucose-Capillary: 304 mg/dL — ABNORMAL HIGH (ref 70–99)

## 2019-10-23 LAB — SARS CORONAVIRUS 2 (TAT 6-24 HRS): SARS Coronavirus 2: NEGATIVE

## 2019-10-23 NOTE — Progress Notes (Signed)
PCP:  Dr. Kennith Maes Cardiologist: Denies  EKG:  10/23/19 CXR:  N/A ECHO:  denies Stress Test:  denies Cardiac Cath:  denies  Fasting Blood Sugar-  Pt does not have glucose meter  Checks Blood Sugar__0_ times a day  Patient states has pre-diabetes.  BG at PAT was 304.  Anesthesia review:  Yes, BG 304  Covid testing today 10/23/19  Patient denies shortness of breath, fever, cough, and chest pain at PAT appointment.  Patient verbalized understanding of instructions provided today at the PAT appointment.  Patient asked to review instructions at home and day of surgery.

## 2019-10-24 NOTE — Anesthesia Preprocedure Evaluation (Addendum)
Anesthesia Evaluation  Patient identified by MRN, date of birth, ID band Patient awake    Reviewed: Allergy & Precautions, H&P , NPO status , Patient's Chart, lab work & pertinent test results  Airway Mallampati: III  TM Distance: >3 FB Neck ROM: Full    Dental no notable dental hx. (+) Teeth Intact, Dental Advisory Given   Pulmonary neg pulmonary ROS, former smoker,    Pulmonary exam normal breath sounds clear to auscultation       Cardiovascular Exercise Tolerance: Good hypertension, Pt. on medications  Rhythm:Regular Rate:Normal     Neuro/Psych negative neurological ROS  negative psych ROS   GI/Hepatic Neg liver ROS, GERD  Medicated and Controlled,  Endo/Other  diabetes, Type 2, Oral Hypoglycemic Agents  Renal/GU negative Renal ROS  negative genitourinary   Musculoskeletal   Abdominal   Peds  Hematology negative hematology ROS (+)   Anesthesia Other Findings   Reproductive/Obstetrics negative OB ROS                           Anesthesia Physical Anesthesia Plan  ASA: II  Anesthesia Plan: General   Post-op Pain Management:    Induction: Intravenous  PONV Risk Score and Plan: 3 and Ondansetron, Dexamethasone and Midazolam  Airway Management Planned: Oral ETT  Additional Equipment:   Intra-op Plan:   Post-operative Plan: Extubation in OR  Informed Consent: I have reviewed the patients History and Physical, chart, labs and discussed the procedure including the risks, benefits and alternatives for the proposed anesthesia with the patient or authorized representative who has indicated his/her understanding and acceptance.     Dental advisory given  Plan Discussed with: CRNA  Anesthesia Plan Comments: (PAT note written 10/24/2019 by Myra Gianotti, PA-C. )       Anesthesia Quick Evaluation

## 2019-10-24 NOTE — Progress Notes (Signed)
Anesthesia Chart Review:  Case: [id number] Date/Time: 10/25/19 0815   Procedure: Microdiscectomy - left - L2-L3 - right - L4-L5 redo (Bilateral Back)   Anesthesia type: General   Pre-op diagnosis: HNP   Location: [facility] OR ROOM 18 / [place] OR   Surgeon: Kary Kos, MD      DISCUSSION: Patient is a 68 year old male scheduled for the above procedure.  History includes former smoker, chronic back pain, DM2, GERD, cervical fusion 1994. BMI is consistent with obesity.   Non-fasting CBG 304 and glucose 293 with BMET. He does not check his CBGs at home as he does not have a glucometer. A1c 8.2, consistent with mean plasma glucose of 188.64 which was routed to Dr. Saintclair Halsted.   Preoperative EKG showed NSR. He denied SOB, chest pain, cough, and fever at PAT RN visit. He will get a fasting CBG on arrival, and if results acceptable then I would anticipate that he can proceed as planned. COVID-19 test negative on 10/23/19.    VS: BP 129/79   Pulse 99   Temp 37 C (Oral)   Resp 18   Ht 6' (1.829 m)   Wt 110.4 kg   SpO2 98%   BMI 33.02 kg/m   PROVIDERS: Ronita Hipps, MD is PCP    LABS: Preoperative labs noted. K 5.0, but specimen hemolyzed. A1c 8.2% which was routed to surgeon.  (all labs ordered are listed, but only abnormal results are displayed)  Labs Reviewed  GLUCOSE, CAPILLARY - Abnormal; Notable for the following components:      Result Value   Glucose-Capillary 304 (*)    All other components within normal limits  BASIC METABOLIC PANEL - Abnormal; Notable for the following components:   Glucose, Bld 293 (*)    All other components within normal limits  HEMOGLOBIN A1C - Abnormal; Notable for the following components:   Hgb A1c MFr Bld 8.2 (*)    All other components within normal limits  SURGICAL PCR SCREEN  CBC     EKG: 10/23/19: NSR   CV: N/A   Past Medical History:  Diagnosis Date  . Chronic back pain   . Diabetes mellitus without complication ([place])   . GERD (gastroesophageal  reflux disease)   . Hyperlipidemia   . Hypertension   . Left hydrocele   . Lumbar stenosis   . Wears glasses     Past Surgical History:  Procedure Laterality Date  . CERVICAL FUSION  1994   C6 -- C7  . HYDROCELE EXCISION Left 12/11/2013   Procedure: LEFT HYDROCELECTOMY ADULT;  Surgeon: Claybon Jabs, MD;  Location[facility];  Service: Urology;  Laterality: Left;  . WRIST GANGLION EXCISION Left 2004    MEDICATIONS: . atorvastatin (LIPITOR) 40 MG tablet  . diphenhydrAMINE (BENADRYL) 50 MG capsule  . famotidine (PEPCID) 20 MG tablet  . fexofenadine (ALLEGRA) 180 MG tablet  . ibuprofen (ADVIL) 200 MG tablet  . lisinopril (ZESTRIL) 2.5 MG tablet  . meloxicam (MOBIC) 7.5 MG tablet  . metFORMIN (GLUCOPHAGE-XR) 500 MG 24 hr tablet  . tamsulosin (FLOMAX) 0.4 MG CAPS capsule   No current facility-administered medications for this encounter.     Myra Gianotti, PA-C Surgical Short Stay/Anesthesiology [facility] Phone [phone] [facility] Phone [phone] 10/24/2019 9:31 AM

## 2019-10-25 ENCOUNTER — Ambulatory Visit (HOSPITAL_COMMUNITY): Payer: PPO | Admitting: Vascular Surgery

## 2019-10-25 ENCOUNTER — Ambulatory Visit (HOSPITAL_COMMUNITY): Payer: PPO

## 2019-10-25 ENCOUNTER — Encounter (HOSPITAL_COMMUNITY)
Admission: RE | Disposition: A | Discharge status: Home / Self Care | Payer: Self-pay | Source: Ambulatory Visit | Attending: Neurosurgery

## 2019-10-25 ENCOUNTER — Ambulatory Visit (HOSPITAL_COMMUNITY): Payer: PPO | Admitting: Anesthesiology

## 2019-10-25 ENCOUNTER — Encounter (HOSPITAL_COMMUNITY): Payer: Self-pay | Admitting: Certified Registered Nurse Anesthetist

## 2019-10-25 ENCOUNTER — Other Ambulatory Visit: Payer: Self-pay

## 2019-10-25 ENCOUNTER — Ambulatory Visit (HOSPITAL_COMMUNITY)
Admission: RE | Admit: 2019-10-25 | Discharge: 2019-10-25 | Disposition: A | Discharge status: Home / Self Care | Payer: PPO | Source: Ambulatory Visit | Attending: Neurosurgery | Admitting: Neurosurgery

## 2019-10-25 DIAGNOSIS — Z981 Arthrodesis status: Secondary | ICD-10-CM | POA: Diagnosis not present

## 2019-10-25 DIAGNOSIS — Z419 Encounter for procedure for purposes other than remedying health state, unspecified: Secondary | ICD-10-CM

## 2019-10-25 DIAGNOSIS — M5116 Intervertebral disc disorders with radiculopathy, lumbar region: Secondary | ICD-10-CM | POA: Diagnosis not present

## 2019-10-25 DIAGNOSIS — Z79899 Other long term (current) drug therapy: Secondary | ICD-10-CM | POA: Diagnosis not present

## 2019-10-25 DIAGNOSIS — Z87891 Personal history of nicotine dependence: Secondary | ICD-10-CM | POA: Insufficient documentation

## 2019-10-25 DIAGNOSIS — I10 Essential (primary) hypertension: Secondary | ICD-10-CM | POA: Diagnosis not present

## 2019-10-25 DIAGNOSIS — K219 Gastro-esophageal reflux disease without esophagitis: Secondary | ICD-10-CM | POA: Diagnosis not present

## 2019-10-25 DIAGNOSIS — M48061 Spinal stenosis, lumbar region without neurogenic claudication: Secondary | ICD-10-CM | POA: Insufficient documentation

## 2019-10-25 DIAGNOSIS — Z881 Allergy status to other antibiotic agents status: Secondary | ICD-10-CM | POA: Insufficient documentation

## 2019-10-25 DIAGNOSIS — E119 Type 2 diabetes mellitus without complications: Secondary | ICD-10-CM | POA: Insufficient documentation

## 2019-10-25 DIAGNOSIS — Z7984 Long term (current) use of oral hypoglycemic drugs: Secondary | ICD-10-CM | POA: Insufficient documentation

## 2019-10-25 DIAGNOSIS — Z9889 Other specified postprocedural states: Secondary | ICD-10-CM | POA: Diagnosis not present

## 2019-10-25 DIAGNOSIS — M5136 Other intervertebral disc degeneration, lumbar region: Secondary | ICD-10-CM | POA: Diagnosis not present

## 2019-10-25 DIAGNOSIS — E785 Hyperlipidemia, unspecified: Secondary | ICD-10-CM | POA: Insufficient documentation

## 2019-10-25 DIAGNOSIS — M5126 Other intervertebral disc displacement, lumbar region: Secondary | ICD-10-CM | POA: Diagnosis present

## 2019-10-25 DIAGNOSIS — Z791 Long term (current) use of non-steroidal anti-inflammatories (NSAID): Secondary | ICD-10-CM | POA: Diagnosis not present

## 2019-10-25 HISTORY — PX: LUMBAR LAMINECTOMY/DECOMPRESSION MICRODISCECTOMY: SHX5026

## 2019-10-25 LAB — GLUCOSE, CAPILLARY
Glucose-Capillary: 232 mg/dL — ABNORMAL HIGH (ref 70–99)
Glucose-Capillary: 232 mg/dL — ABNORMAL HIGH (ref 70–99)
Glucose-Capillary: 363 mg/dL — ABNORMAL HIGH (ref 70–99)
Glucose-Capillary: 361 mg/dL — ABNORMAL HIGH (ref 70–99)

## 2019-10-25 SURGERY — LUMBAR LAMINECTOMY/DECOMPRESSION MICRODISCECTOMY 2 LEVELS
Anesthesia: General | Site: Back | Laterality: Bilateral

## 2019-10-25 MED ORDER — ONDANSETRON HCL 4 MG/2ML IJ SOLN: 4.0000 mg | Freq: Four times a day (QID) | INTRAMUSCULAR | Status: DC | PRN

## 2019-10-25 MED ORDER — ONDANSETRON HCL 4 MG PO TABS: 4.0000 mg | ORAL_TABLET | Freq: Four times a day (QID) | ORAL | Status: DC | PRN

## 2019-10-25 MED ORDER — INSULIN ASPART 100 UNIT/ML ~~LOC~~ SOLN: 6.0000 [IU] | Freq: Three times a day (TID) | SUBCUTANEOUS | Status: DC

## 2019-10-25 MED ORDER — SUCCINYLCHOLINE CHLORIDE 200 MG/10ML IV SOSY
PREFILLED_SYRINGE | INTRAVENOUS | Status: AC
  Filled 2019-10-25: qty 10

## 2019-10-25 MED ORDER — BUPIVACAINE HCL (PF) 0.25 % IJ SOLN
INTRAMUSCULAR | Status: AC
  Filled 2019-10-25: qty 30

## 2019-10-25 MED ORDER — ROCURONIUM BROMIDE 10 MG/ML (PF) SYRINGE
PREFILLED_SYRINGE | INTRAVENOUS | Status: DC | PRN
  Administered 2019-10-25 (×5): 10 mg via INTRAVENOUS
  Administered 2019-10-25: 50 mg via INTRAVENOUS

## 2019-10-25 MED ORDER — FENTANYL CITRATE (PF) 250 MCG/5ML IJ SOLN
INTRAMUSCULAR | Status: DC | PRN
  Administered 2019-10-25 (×4): 50 ug via INTRAVENOUS
  Administered 2019-10-25: 100 ug via INTRAVENOUS

## 2019-10-25 MED ORDER — ALUM & MAG HYDROXIDE-SIMETH 200-200-20 MG/5ML PO SUSP: 30.0000 mL | Freq: Four times a day (QID) | ORAL | Status: DC | PRN

## 2019-10-25 MED ORDER — PROPOFOL 10 MG/ML IV BOLUS
INTRAVENOUS | Status: AC
  Filled 2019-10-25: qty 20

## 2019-10-25 MED ORDER — SODIUM CHLORIDE 0.9 % IV SOLN
INTRAVENOUS | Status: DC | PRN
  Administered 2019-10-25: 500 mL

## 2019-10-25 MED ORDER — DIPHENHYDRAMINE HCL 25 MG PO CAPS: 50.0000 mg | ORAL_CAPSULE | Freq: Every day | ORAL | Status: DC

## 2019-10-25 MED ORDER — MIDAZOLAM HCL 2 MG/2ML IJ SOLN
INTRAMUSCULAR | Status: AC
  Filled 2019-10-25: qty 2

## 2019-10-25 MED ORDER — MIDAZOLAM HCL 5 MG/5ML IJ SOLN
INTRAMUSCULAR | Status: DC | PRN
  Administered 2019-10-25 (×2): 1 mg via INTRAVENOUS

## 2019-10-25 MED ORDER — OXYCODONE HCL 5 MG PO TABS: 10.0000 mg | ORAL_TABLET | ORAL | Status: DC | PRN

## 2019-10-25 MED ORDER — METFORMIN HCL ER 500 MG PO TB24
500.0000 mg | ORAL_TABLET | Freq: Every day | ORAL | Status: DC
  Filled 2019-10-25: qty 1

## 2019-10-25 MED ORDER — ONDANSETRON HCL 4 MG/2ML IJ SOLN
INTRAMUSCULAR | Status: DC | PRN
  Administered 2019-10-25: 4 mg via INTRAVENOUS

## 2019-10-25 MED ORDER — ALBUMIN HUMAN 5 % IV SOLN
INTRAVENOUS | Status: DC | PRN
  Administered 2019-10-25: 09:00:00 via INTRAVENOUS

## 2019-10-25 MED ORDER — MELOXICAM 7.5 MG PO TABS
7.5000 mg | ORAL_TABLET | Freq: Two times a day (BID) | ORAL | Status: DC
  Filled 2019-10-25: qty 1

## 2019-10-25 MED ORDER — LIDOCAINE-EPINEPHRINE 1 %-1:100000 IJ SOLN
INTRAMUSCULAR | Status: DC | PRN
  Administered 2019-10-25: 10 mL

## 2019-10-25 MED ORDER — INSULIN ASPART 100 UNIT/ML ~~LOC~~ SOLN
0.0000 [IU] | Freq: Three times a day (TID) | SUBCUTANEOUS | Status: DC
  Administered 2019-10-25: 15 [IU] via SUBCUTANEOUS

## 2019-10-25 MED ORDER — LIDOCAINE 2% (20 MG/ML) 5 ML SYRINGE
INTRAMUSCULAR | Status: AC
  Filled 2019-10-25: qty 5

## 2019-10-25 MED ORDER — LACTATED RINGERS IV SOLN
INTRAVENOUS | Status: DC | PRN
  Administered 2019-10-25 (×2): via INTRAVENOUS

## 2019-10-25 MED ORDER — CEFAZOLIN SODIUM-DEXTROSE 2-4 GM/100ML-% IV SOLN
2.0000 g | Freq: Three times a day (TID) | INTRAVENOUS | Status: DC
  Administered 2019-10-25: 2 g via INTRAVENOUS
  Filled 2019-10-25: qty 100

## 2019-10-25 MED ORDER — IBUPROFEN 200 MG PO TABS
400.0000 mg | ORAL_TABLET | Freq: Three times a day (TID) | ORAL | Status: DC | PRN
  Administered 2019-10-25: 400 mg via ORAL
  Filled 2019-10-25: qty 3

## 2019-10-25 MED ORDER — CHLORHEXIDINE GLUCONATE CLOTH 2 % EX PADS: 6.0000 | MEDICATED_PAD | Freq: Once | CUTANEOUS | Status: DC

## 2019-10-25 MED ORDER — DEXAMETHASONE SODIUM PHOSPHATE 10 MG/ML IJ SOLN
10.0000 mg | Freq: Once | INTRAMUSCULAR | Status: AC
  Administered 2019-10-25: 10 mg via INTRAVENOUS
  Filled 2019-10-25: qty 1

## 2019-10-25 MED ORDER — ACETAMINOPHEN ER 650 MG PO TBCR: 650.0000 mg | EXTENDED_RELEASE_TABLET | Freq: Three times a day (TID) | ORAL | Status: DC | PRN

## 2019-10-25 MED ORDER — FAMOTIDINE 20 MG PO TABS: 20.0000 mg | ORAL_TABLET | Freq: Every day | ORAL | Status: DC

## 2019-10-25 MED ORDER — ONDANSETRON HCL 4 MG/2ML IJ SOLN
INTRAMUSCULAR | Status: AC
  Filled 2019-10-25: qty 2

## 2019-10-25 MED ORDER — THROMBIN 5000 UNITS EX SOLR
CUTANEOUS | Status: AC
  Filled 2019-10-25: qty 10000

## 2019-10-25 MED ORDER — SUCCINYLCHOLINE CHLORIDE 200 MG/10ML IV SOSY
PREFILLED_SYRINGE | INTRAVENOUS | Status: DC | PRN
  Administered 2019-10-25: 100 mg via INTRAVENOUS

## 2019-10-25 MED ORDER — SODIUM CHLORIDE 0.9% FLUSH: 3.0000 mL | INTRAVENOUS | Status: DC | PRN

## 2019-10-25 MED ORDER — ACETAMINOPHEN 325 MG PO TABS: 650.0000 mg | ORAL_TABLET | ORAL | Status: DC | PRN

## 2019-10-25 MED ORDER — TAMSULOSIN HCL 0.4 MG PO CAPS: 0.4000 mg | ORAL_CAPSULE | Freq: Every day | ORAL | Status: DC

## 2019-10-25 MED ORDER — ROCURONIUM BROMIDE 10 MG/ML (PF) SYRINGE
PREFILLED_SYRINGE | INTRAVENOUS | Status: AC
  Filled 2019-10-25: qty 10

## 2019-10-25 MED ORDER — PHENOL 1.4 % MT LIQD: 1.0000 | OROMUCOSAL | Status: DC | PRN

## 2019-10-25 MED ORDER — LIDOCAINE 2% (20 MG/ML) 5 ML SYRINGE
INTRAMUSCULAR | Status: DC | PRN
  Administered 2019-10-25: 60 mg via INTRAVENOUS

## 2019-10-25 MED ORDER — LIVING WELL WITH DIABETES BOOK
Freq: Once | Status: AC
  Administered 2019-10-25: 19:00:00 1
  Filled 2019-10-25: qty 1

## 2019-10-25 MED ORDER — SODIUM CHLORIDE 0.9% FLUSH: 3.0000 mL | Freq: Two times a day (BID) | INTRAVENOUS | Status: DC

## 2019-10-25 MED ORDER — LORATADINE 10 MG PO TABS: 10.0000 mg | ORAL_TABLET | Freq: Every day | ORAL | Status: DC

## 2019-10-25 MED ORDER — LIDOCAINE-EPINEPHRINE 1 %-1:100000 IJ SOLN
INTRAMUSCULAR | Status: AC
  Filled 2019-10-25: qty 1

## 2019-10-25 MED ORDER — EPHEDRINE 5 MG/ML INJ
INTRAVENOUS | Status: AC
  Filled 2019-10-25: qty 10

## 2019-10-25 MED ORDER — CYCLOBENZAPRINE HCL 10 MG PO TABS: 10.0000 mg | ORAL_TABLET | Freq: Three times a day (TID) | ORAL | Status: DC | PRN

## 2019-10-25 MED ORDER — 0.9 % SODIUM CHLORIDE (POUR BTL) OPTIME
TOPICAL | Status: DC | PRN
  Administered 2019-10-25: 1000 mL

## 2019-10-25 MED ORDER — ACETAMINOPHEN 500 MG PO TABS
1000.0000 mg | ORAL_TABLET | Freq: Once | ORAL | Status: AC
  Administered 2019-10-25: 1000 mg via ORAL
  Filled 2019-10-25: qty 2

## 2019-10-25 MED ORDER — ACETAMINOPHEN 650 MG RE SUPP: 650.0000 mg | RECTAL | Status: DC | PRN

## 2019-10-25 MED ORDER — PHENYLEPHRINE 40 MCG/ML (10ML) SYRINGE FOR IV PUSH (FOR BLOOD PRESSURE SUPPORT)
PREFILLED_SYRINGE | INTRAVENOUS | Status: AC
  Filled 2019-10-25: qty 10

## 2019-10-25 MED ORDER — LISINOPRIL 2.5 MG PO TABS
2.5000 mg | ORAL_TABLET | Freq: Every evening | ORAL | Status: DC
  Filled 2019-10-25: qty 1

## 2019-10-25 MED ORDER — MENTHOL 3 MG MT LOZG: 1.0000 | LOZENGE | OROMUCOSAL | Status: DC | PRN

## 2019-10-25 MED ORDER — FENTANYL CITRATE (PF) 250 MCG/5ML IJ SOLN
INTRAMUSCULAR | Status: AC
  Filled 2019-10-25: qty 5

## 2019-10-25 MED ORDER — CEFAZOLIN SODIUM-DEXTROSE 2-4 GM/100ML-% IV SOLN
2.0000 g | INTRAVENOUS | Status: AC
  Administered 2019-10-25: 2 g via INTRAVENOUS
  Filled 2019-10-25: qty 100

## 2019-10-25 MED ORDER — THROMBIN 5000 UNITS EX SOLR
CUTANEOUS | Status: DC | PRN
  Administered 2019-10-25 (×2): 5000 [IU] via TOPICAL

## 2019-10-25 MED ORDER — HYDROMORPHONE HCL 1 MG/ML IJ SOLN: 0.5000 mg | INTRAMUSCULAR | Status: DC | PRN

## 2019-10-25 MED ORDER — PANTOPRAZOLE SODIUM 40 MG IV SOLR: 40.0000 mg | Freq: Every day | INTRAVENOUS | Status: DC

## 2019-10-25 MED ORDER — HYDROMORPHONE HCL 1 MG/ML IJ SOLN: 0.2500 mg | INTRAMUSCULAR | Status: DC | PRN

## 2019-10-25 MED ORDER — PROPOFOL 10 MG/ML IV BOLUS
INTRAVENOUS | Status: DC | PRN
  Administered 2019-10-25: 20 mg via INTRAVENOUS
  Administered 2019-10-25: 120 mg via INTRAVENOUS

## 2019-10-25 MED ORDER — PHENYLEPHRINE 40 MCG/ML (10ML) SYRINGE FOR IV PUSH (FOR BLOOD PRESSURE SUPPORT)
PREFILLED_SYRINGE | INTRAVENOUS | Status: DC | PRN
  Administered 2019-10-25: 160 ug via INTRAVENOUS
  Administered 2019-10-25 (×3): 80 ug via INTRAVENOUS

## 2019-10-25 MED ORDER — ATORVASTATIN CALCIUM 40 MG PO TABS
40.0000 mg | ORAL_TABLET | Freq: Every evening | ORAL | Status: DC
  Filled 2019-10-25: qty 1

## 2019-10-25 MED ORDER — HEMOSTATIC AGENTS (NO CHARGE) OPTIME
TOPICAL | Status: DC | PRN
  Administered 2019-10-25: 1 via TOPICAL

## 2019-10-25 MED ORDER — PHENYLEPHRINE HCL-NACL 10-0.9 MG/250ML-% IV SOLN
INTRAVENOUS | Status: DC | PRN
  Administered 2019-10-25: 50 ug/min via INTRAVENOUS
  Administered 2019-10-25: 11:00:00 via INTRAVENOUS

## 2019-10-25 MED ORDER — SUGAMMADEX SODIUM 200 MG/2ML IV SOLN
INTRAVENOUS | Status: DC | PRN
  Administered 2019-10-25: 200 mg via INTRAVENOUS

## 2019-10-25 MED ORDER — INSULIN ASPART 100 UNIT/ML ~~LOC~~ SOLN
5.0000 [IU] | Freq: Once | SUBCUTANEOUS | Status: AC
  Administered 2019-10-25: 5 [IU] via SUBCUTANEOUS

## 2019-10-25 MED ORDER — OXYCODONE-ACETAMINOPHEN 5-325 MG PO TABS: 1.0000 | ORAL_TABLET | ORAL | 0 refills | Status: DC | PRN

## 2019-10-25 MED ORDER — SODIUM CHLORIDE 0.9 % IV SOLN: 250.0000 mL | INTRAVENOUS | Status: DC

## 2019-10-25 MED ORDER — EPHEDRINE SULFATE-NACL 50-0.9 MG/10ML-% IV SOSY
PREFILLED_SYRINGE | INTRAVENOUS | Status: DC | PRN
  Administered 2019-10-25: 5 mg via INTRAVENOUS
  Administered 2019-10-25: 10 mg via INTRAVENOUS
  Administered 2019-10-25: 5 mg via INTRAVENOUS

## 2019-10-25 SURGICAL SUPPLY — 61 items
APL SKNCLS STERI-STRIP NONHPOA (GAUZE/BANDAGES/DRESSINGS) ×1
BAG DECANTER FOR FLEXI CONT (MISCELLANEOUS) ×3 IMPLANT
BAND INSRT 18 STRL LF DISP RB (MISCELLANEOUS) ×2
BAND RUBBER #18 3X1/16 STRL (MISCELLANEOUS) ×6 IMPLANT
BENZOIN TINCTURE PRP APPL 2/3 (GAUZE/BANDAGES/DRESSINGS) ×3 IMPLANT
BLADE CLIPPER SURG (BLADE) ×3 IMPLANT
BLADE SURG 11 STRL SS (BLADE) ×3 IMPLANT
BUR CUTTER 7.0 ROUND (BURR) ×3 IMPLANT
BUR MATCHSTICK NEURO 3.0 LAGG (BURR) ×3 IMPLANT
CANISTER SUCT 3000ML PPV (MISCELLANEOUS) ×3 IMPLANT
CARTRIDGE OIL MAESTRO DRILL (MISCELLANEOUS) ×1 IMPLANT
CLOSURE WOUND 1/2 X4 (GAUZE/BANDAGES/DRESSINGS) ×1
COVER WAND RF STERILE (DRAPES) ×3 IMPLANT
DECANTER SPIKE VIAL GLASS SM (MISCELLANEOUS) ×3 IMPLANT
DERMABOND ADVANCED (GAUZE/BANDAGES/DRESSINGS) ×2
DERMABOND ADVANCED .7 DNX12 (GAUZE/BANDAGES/DRESSINGS) ×1 IMPLANT
DIFFUSER DRILL AIR PNEUMATIC (MISCELLANEOUS) ×3 IMPLANT
DRAPE HALF SHEET 40X57 (DRAPES) IMPLANT
DRAPE LAPAROTOMY 100X72X124 (DRAPES) ×3 IMPLANT
DRAPE MICROSCOPE LEICA (MISCELLANEOUS) ×3 IMPLANT
DRAPE SURG 17X23 STRL (DRAPES) ×3 IMPLANT
DRSG OPSITE POSTOP 4X8 (GAUZE/BANDAGES/DRESSINGS) ×3 IMPLANT
DURAPREP 26ML APPLICATOR (WOUND CARE) ×3 IMPLANT
ELECT BLADE 4.0 EZ CLEAN MEGAD (MISCELLANEOUS) ×3
ELECT REM PT RETURN 9FT ADLT (ELECTROSURGICAL) ×3
ELECTRODE BLDE 4.0 EZ CLN MEGD (MISCELLANEOUS) ×1 IMPLANT
ELECTRODE REM PT RTRN 9FT ADLT (ELECTROSURGICAL) ×1 IMPLANT
GAUZE 4X4 16PLY RFD (DISPOSABLE) ×3 IMPLANT
GAUZE SPONGE 4X4 12PLY STRL (GAUZE/BANDAGES/DRESSINGS) ×3 IMPLANT
GLOVE BIO SURGEON STRL SZ7 (GLOVE) IMPLANT
GLOVE BIO SURGEON STRL SZ8 (GLOVE) ×3 IMPLANT
GLOVE BIOGEL PI IND STRL 6.5 (GLOVE) ×3 IMPLANT
GLOVE BIOGEL PI IND STRL 7.0 (GLOVE) ×1 IMPLANT
GLOVE BIOGEL PI INDICATOR 6.5 (GLOVE) ×6
GLOVE BIOGEL PI INDICATOR 7.0 (GLOVE) ×2
GLOVE EXAM NITRILE XL STR (GLOVE) IMPLANT
GLOVE INDICATOR 8.5 STRL (GLOVE) ×3 IMPLANT
GLOVE SURG SS PI 6.5 STRL IVOR (GLOVE) ×3 IMPLANT
GOWN STRL REUS W/ TWL LRG LVL3 (GOWN DISPOSABLE) ×2 IMPLANT
GOWN STRL REUS W/ TWL XL LVL3 (GOWN DISPOSABLE) ×1 IMPLANT
GOWN STRL REUS W/TWL 2XL LVL3 (GOWN DISPOSABLE) IMPLANT
GOWN STRL REUS W/TWL LRG LVL3 (GOWN DISPOSABLE) ×6
GOWN STRL REUS W/TWL XL LVL3 (GOWN DISPOSABLE) ×3
KIT BASIN OR (CUSTOM PROCEDURE TRAY) ×3 IMPLANT
KIT TURNOVER KIT B (KITS) ×3 IMPLANT
NEEDLE HYPO 22GX1.5 SAFETY (NEEDLE) ×3 IMPLANT
NEEDLE SPNL 22GX3.5 QUINCKE BK (NEEDLE) ×6 IMPLANT
NS IRRIG 1000ML POUR BTL (IV SOLUTION) ×3 IMPLANT
OIL CARTRIDGE MAESTRO DRILL (MISCELLANEOUS) ×3
PACK LAMINECTOMY NEURO (CUSTOM PROCEDURE TRAY) ×3 IMPLANT
SPONGE SURGIFOAM ABS GEL SZ50 (HEMOSTASIS) ×3 IMPLANT
STRIP CLOSURE SKIN 1/2X4 (GAUZE/BANDAGES/DRESSINGS) ×2 IMPLANT
SUT MNCRL 0 MO-4 VIOLET 18 CR (SUTURE) ×1 IMPLANT
SUT MONOCRYL 0 MO 4 18  CR/8 (SUTURE) ×2
SUT VIC AB 0 CT1 18XCR BRD8 (SUTURE) ×1 IMPLANT
SUT VIC AB 0 CT1 8-18 (SUTURE) ×3
SUT VIC AB 2-0 CT1 18 (SUTURE) ×3 IMPLANT
SUT VICRYL 4-0 PS2 18IN ABS (SUTURE) ×3 IMPLANT
TOWEL GREEN STERILE (TOWEL DISPOSABLE) ×3 IMPLANT
TOWEL GREEN STERILE FF (TOWEL DISPOSABLE) ×3 IMPLANT
WATER STERILE IRR 1000ML POUR (IV SOLUTION) ×3 IMPLANT

## 2019-10-25 NOTE — Discharge Summary (Addendum)
Physician Discharge Summary  Patient ID: Xavier Willis MRN: [id number] DOB/AGE: [birth year] 68 y.o. Estimated body mass index is 33.02 kg/m as calculated from the following:   Height as of this encounter: 6' (1.829 m).   Weight as of this encounter: 110.4 kg.   Admit date: 10/25/2019 Discharge date: 10/25/2019  Admission Diagnoses:HNP L45 right spinal stenosis L23  Discharge Diagnoses: same Active Problems:   HNP (herniated nucleus pulposus), lumbar   Discharged Condition: good  Hospital Course: pateitn admitted and underwent laminectomy and diskectomy.  Postoperatively patient ws doing well and ambulating and voiding and tolerating a regular diet.  CBG elevate so will be seen by Diabetic coordinator prior to discharge  Consults: Significant Diagnostic Studies: Treatments:L45 redo diskectomy right, DLL L23 left Discharge Exam: Blood pressure 125/66, pulse (!) 104, temperature 98.1 F (36.7 C), temperature source Oral, resp. rate 16, height 6' (1.829 m), weight 110.4 kg, SpO2 96 %. Strength 5/5  Disposition: home   Allergies as of 10/25/2019      Reactions   Doxycycline Hives   Vancomycin Hives   Bactrim [sulfamethoxazole-trimethoprim] Rash   AND FLUSHING      Medication List    TAKE these medications   Advil 200 MG tablet Generic drug: ibuprofen Take 400-600 mg by mouth every 8 (eight) hours as needed (pain).   atorvastatin 40 MG tablet Commonly known as: LIPITOR Take 40 mg by mouth every evening.   diphenhydrAMINE 50 MG capsule Commonly known as: BENADRYL Take 50 mg by mouth at bedtime.   famotidine 20 MG tablet Commonly known as: PEPCID Take 20 mg by mouth at bedtime.   fexofenadine 180 MG tablet Commonly known as: ALLEGRA Take 180 mg by mouth daily.   lisinopril 2.5 MG tablet Commonly known as: ZESTRIL Take 2.5 mg by mouth every evening.   meloxicam 7.5 MG tablet Commonly known as: MOBIC Take 7.5 mg by mouth 2 (two) times daily.   metFORMIN  500 MG 24 hr tablet Commonly known as: GLUCOPHAGE-XR Take 500 mg by mouth daily with breakfast.   oxyCODONE-acetaminophen 5-325 MG tablet Commonly known as: Percocet Take 1 tablet by mouth every 4 (four) hours as needed for severe pain.   tamsulosin 0.4 MG Caps capsule Commonly known as: FLOMAX Take 0.4 mg by mouth.   Tylenol 8 Hour Arthritis Pain 650 MG CR tablet Generic drug: acetaminophen Take 650 mg by mouth every 8 (eight) hours as needed for pain.      Follow-up Information    Kary Kos, MD Follow up.   Specialty: Neurosurgery Contact information: [address] [place] [address] [place] [address] [phone]           Signed: Ocie Cornfield [facility] 10/25/2019, 4:29 PM

## 2019-10-25 NOTE — Progress Notes (Signed)
Follow-up CBG was 361. Xavier Mercury, NP notified and orders were give to administer 5 additional units of Novolog. Pt can still be D/C'd home per MD order. Holli Humbles, RN

## 2019-10-25 NOTE — Transfer of Care (Signed)
Immediate Anesthesia Transfer of Care Note  Patient: Xavier Willis  Procedure(s) Performed: Microdiscectomy - left - Lumbar two-three - right - Lumbar four-Lumbar five redo (Bilateral Back)  Patient Location: PACU  Anesthesia Type:General  Level of Consciousness: awake, alert  and oriented  Airway & Oxygen Therapy: Patient Spontanous Breathing and Patient connected to face mask oxygen  Post-op Assessment: Report given to RN, Post -op Vital signs reviewed and stable and Patient moving all extremities X 4  Post vital signs: Reviewed and stable  Last Vitals:  Vitals Value Taken Time  BP 134/67 10/25/19 1143  Temp    Pulse 95 10/25/19 1144  Resp 10 10/25/19 1144  SpO2 98 % 10/25/19 1144  Vitals shown include unvalidated device data.  Last Pain:  Vitals:   10/25/19 0659  TempSrc: Oral  PainSc: 0-No pain      Patients Stated Pain Goal: 3 ([id number])  Complications: No apparent anesthesia complications

## 2019-10-25 NOTE — Anesthesia Postprocedure Evaluation (Signed)
Anesthesia Post Note  Patient: Xavier Willis  Procedure(s) Performed: Microdiscectomy - left - Lumbar two-three - right - Lumbar four-Lumbar five redo (Bilateral Back)     Patient location during evaluation: PACU Anesthesia Type: General Level of consciousness: awake and alert Pain management: pain level controlled Vital Signs Assessment: post-procedure vital signs reviewed and stable Respiratory status: spontaneous breathing, nonlabored ventilation and respiratory function stable Cardiovascular status: blood pressure returned to baseline and stable Postop Assessment: no apparent nausea or vomiting Anesthetic complications: no    Last Vitals:  Vitals:   10/25/19 1143 10/25/19 1145  BP: 134/67   Pulse: 96 97  Resp: 13 15  Temp: 36.6 C   SpO2: 98% 98%    Last Pain:  Vitals:   10/25/19 1143  TempSrc:   PainSc: Asleep                 Joye Wesenberg,W. EDMOND

## 2019-10-25 NOTE — Progress Notes (Addendum)
Inpatient Diabetes Program Recommendations  AACE/ADA: New Consensus Statement on Inpatient Glycemic Control (2015)  Target Ranges:  Prepandial:   less than 140 mg/dL      Peak postprandial:   less than 180 mg/dL (1-2 hours)      Critically ill patients:  140 - 180 mg/dL   Lab Results  Component Value Date   GLUCAP 363 (H) 10/25/2019   HGBA1C 8.2 (H) 10/23/2019    Review of Glycemic Control Results for ZIAH, DEIS (MRN [id number]) as of 10/25/2019 16:29  Ref. Range 10/25/2019 06:50 10/25/2019 11:45 10/25/2019 16:08  Glucose-Capillary Latest Ref Range: 70 - 99 mg/dL 232 (H) 232 (H) 363 (H)   Diabetes history: DM 2 Outpatient Diabetes medications:  Metformin 500 mg daily Current orders for Inpatient glycemic control:  Novolog resistant tid with meals Novolog 6 units tid with meals  Inpatient Diabetes Program Recommendations:    Note that blood sugar increased post surgery.  Patient received 10 mg IV Decadron x1 in surgery which likely has contributed to increased blood sugar post-surgery.  Patient is ready to d/c home.  He has received Novolog 15 units x1 for high blood sugar.  Per RN, he is ready for d/c home.   Recommend that patient check blood sugars tid with meals and HS and to f/u with PCP if blood sugars >200 mg/dL.  A1C indicates that average home glucose approximately 186 mg/dL.   Spoke with patient regarding A1C results.  He states that he was told that he had pre-diabetes in the past.  We discussed A1C of 8.2 %.  Patient states that he has not been able to exercise in the past 3 months and has been "sitting a lot".  We briefly discussed what DM is, lifestyle modification such as cutting out sugar in his beverages, and medications such as "Metformin".  Also discussed importance of glycemic control for healing after surgery.  Discussed monitoring, normal blood sugars and the importance of F/U with PCP.  Wife and patient very receptive.  Ordered LWWD booklet as well.    Thanks,  Adah Perl, RN, BC-ADM Inpatient Diabetes Coordinator Pager [phone] (8a-5p)

## 2019-10-25 NOTE — H&P (Signed)
Xavier Willis is an 68 y.o. male.   Chief Complaint: Back and bilateral leg pain HPI: 68 year old gentleman with progressive worsening back bilateral leg pain L3 on the left L4-5 on the right patient has failed all forms conservative treatment imaging is shown discriminations with severe stenosis at L2-3 on the left and L4-5 on the right.  Due to patient progression of clinical syndrome imaging findings and failure of conservative treatment I recommended laminotomies and microdiscectomies at both levels.  I have extensively gone over the risks and benefits of that operation with him as well as perioperative course expectations of outcome and alternatives of surgery and he understands and agrees to proceed forward.  Past Medical History:  Diagnosis Date  . Chronic back pain   . Diabetes mellitus without complication ([place])   . GERD (gastroesophageal reflux disease)   . Hyperlipidemia   . Hypertension   . Left hydrocele   . Lumbar stenosis   . Wears glasses     Past Surgical History:  Procedure Laterality Date  . CERVICAL FUSION  1994   C6 -- C7  . HYDROCELE EXCISION Left 12/11/2013   Procedure: LEFT HYDROCELECTOMY ADULT;  Surgeon: Claybon Jabs, MD;  Location: [facility];  Service: Urology;  Laterality: Left;  . WRIST GANGLION EXCISION Left 2004    History reviewed. No pertinent family history. Social History:  reports that he has quit smoking. He quit smokeless tobacco use about 10 years ago.  His smokeless tobacco use included snuff. He reports current alcohol use of about 14.0 standard drinks of alcohol per week. He reports that he does not use drugs.  Allergies:  Allergies  Allergen Reactions  . Doxycycline Hives  . Vancomycin Hives  . Bactrim [Sulfamethoxazole-Trimethoprim] Rash    AND FLUSHING    Medications Prior to Admission  Medication Sig Dispense Refill  . acetaminophen (TYLENOL 8 HOUR ARTHRITIS PAIN) 650 MG CR tablet Take 650 mg by mouth every 8  (eight) hours as needed for pain.    Marland Kitchen atorvastatin (LIPITOR) 40 MG tablet Take 40 mg by mouth every evening.    . diphenhydrAMINE (BENADRYL) 50 MG capsule Take 50 mg by mouth at bedtime.    . famotidine (PEPCID) 20 MG tablet Take 20 mg by mouth at bedtime.    . fexofenadine (ALLEGRA) 180 MG tablet Take 180 mg by mouth daily.    Marland Kitchen ibuprofen (ADVIL) 200 MG tablet Take 400-600 mg by mouth every 8 (eight) hours as needed (pain).     Marland Kitchen lisinopril (ZESTRIL) 2.5 MG tablet Take 2.5 mg by mouth every evening.     . meloxicam (MOBIC) 7.5 MG tablet Take 7.5 mg by mouth 2 (two) times daily.    . metFORMIN (GLUCOPHAGE-XR) 500 MG 24 hr tablet Take 500 mg by mouth daily with breakfast.     . tamsulosin (FLOMAX) 0.4 MG CAPS capsule Take 0.4 mg by mouth.      Results for orders placed or performed during the hospital encounter of 10/23/19 (from the past 48 hour(s))  SARS CORONAVIRUS 2 (TAT 6-24 HRS) Nasopharyngeal Nasopharyngeal Swab     Status: None   Collection Time: 10/23/19 10:41 AM   Specimen: Nasopharyngeal Swab  Result Value Ref Range   SARS Coronavirus 2 NEGATIVE NEGATIVE    Comment: (NOTE) SARS-CoV-2 target nucleic acids are NOT DETECTED. The SARS-CoV-2 RNA is generally detectable in upper and lower respiratory specimens during the acute phase of infection. Negative results do not preclude SARS-CoV-2 infection, do  not rule out co-infections with other pathogens, and should not be used as the sole basis for treatment or other patient management decisions. Negative results must be combined with clinical observations, patient history, and epidemiological information. The expected result is Negative. Fact Sheet for Patients: SugarRoll.be Fact Sheet for Healthcare Providers: https://www.woods-mathews.com/ This test is not yet approved or cleared by the [place] FDA and  has been authorized for detection and/or diagnosis of SARS-CoV-2 by FDA under an  Emergency Use Authorization (EUA). This EUA will remain  in effect (meaning this test can be used) for the duration of the COVID-19 declaration under Section 56 4(b)(1) of the Act, 21 U.S.C. section 360bbb-3(b)(1), unless the authorization is terminated or revoked sooner. Performed at [facility], [place] [address] [place], [place] [address]    No results found.  Review of Systems  Musculoskeletal: Positive for back pain and joint pain.  Neurological: Positive for tingling and sensory change.    Blood pressure (!) 154/88, pulse 96, temperature 98.6 F (37 C), temperature source Oral, resp. rate 18, height 6' (1.829 m), weight 110.4 kg, SpO2 99 %. Physical Exam  Constitutional: He is oriented to person, place, and time. He appears well-developed.  HENT:  Head: Normocephalic.  Eyes: Pupils are equal, round, and reactive to light.  Neck: Normal range of motion.  Respiratory: Effort normal.  GI: Soft.  Neurological: He is alert and oriented to person, place, and time. He has normal strength. GCS eye subscore is 4. GCS verbal subscore is 5. GCS motor subscore is 6.  Strength 5 out of 5 iliopsoas, quads, hamstrings, gastroc, into tibialis, and EHL on the left he has slight EHL weakness on the right.  Skin: Skin is warm.     Assessment/Plan 68 year old gentleman presents for L2-3 decompressive laminectomy microdiscectomy on the left and an L4-5 lamina 2 microdiscectomy on the right.  Xavier Willis P, MD 10/25/2019, 8:15 AM

## 2019-10-25 NOTE — Anesthesia Procedure Notes (Signed)
Procedure Name: Intubation Date/Time: 10/25/2019 8:43 AM Performed by: Harden Mo, CRNA Pre-anesthesia Checklist: Patient identified, Emergency Drugs available, Suction available and Patient being monitored Patient Re-evaluated:Patient Re-evaluated prior to induction Oxygen Delivery Method: Circle System Utilized Preoxygenation: Pre-oxygenation with 100% oxygen Induction Type: IV induction and Rapid sequence Laryngoscope Size: Glidescope and 4 Grade View: Grade I Tube type: Oral Tube size: 7.5 mm Number of attempts: 1 Airway Equipment and Method: Stylet and Oral airway Placement Confirmation: ETT inserted through vocal cords under direct vision,  positive ETCO2 and breath sounds checked- equal and bilateral Secured at: 24 cm Tube secured with: Tape Dental Injury: Teeth and Oropharynx as per pre-operative assessment

## 2019-10-25 NOTE — Discharge Instructions (Signed)

## 2019-10-25 NOTE — Progress Notes (Signed)
Pt and wife given D/C instructions with verbal understanding. Rx's were sent to pharmacy by MD. Pt's incision is clean and dry with no sign of infection. Pt's IV was removed prior to D/C. Pt D/C'd home via wheelchair @ 1830 per MD order. Pt is stable @ D/C and has no other needs at this time. Holli Humbles, RN

## 2019-10-25 NOTE — Op Note (Signed)
Preoperative diagnosis: #1 lumbar spinal stenosis L2-3 with left-sided L3 radiculopathy  2.  Recurrent herniated nucleus pulposus L4-5 right with right L5 radiculopathy  Procedure: #1 decompressive lumbar laminectomy L2-3 on the left with microdissection of the left L3 nerve root microscopic foraminotomy  2.  Through a separate skin incision redo lumbar microdiscectomy L4-5 right with microscopic discectomy microscopic dissection and foraminotomies of the L5 nerve root  Surgeon: Dominica Severin Terika Pillard  Assistant: Nash Shearer  Anesthesia: General  EBL: Minimal  HPI: 68 year old gentleman with longstanding back and bilateral leg pain work-up revealed severe spinal stenosis at L2-3 as well as a recurrent herniated nucleus pulposus L4-5 right.  Due to patient's bilateral symptoms imaging findings failed conservative treatment I recommended decompressive laminectomy at L2-3 on the left and redo microdiscectomy L4-5 on the right.  I extensively went over the risks and benefits of the operation with him as well as perioperative course expectations of outcome and alternatives of surgery and he understood and agreed to proceed forward.  Operative procedure: Patient was brought into the OR was due to general anesthesia positioned prone the Wilson frame his back was prepped and draped in routine sterile fashion preoperative x-ray localized the appropriate 2 levels had L2-3 and L4-5.  So after infiltration of 10 cc lidocaine with epi both incisions were opened up and subperiosteal dissection was carried out at L2-3 on the left and through the scar tissue at L4-5 on the right intraoperative x-ray confirmed defecation the appropriate levels on both sides.  First working at L2-3 laminotomy was begun with a high-speed drill on the 2 and 3 Miller Kerrison punch.  Operating microscope was draped and brought into the field in [place] illumination I under bit the medial facet complex identified the L3 pedicle and performed a  foraminotomy of the L3 nerve root there was marked hourglass compression thecal sac at this level from severe ligamentous hypertrophy and a large bone spur coming off the medial facet joint this was all removed decompressing the central canal as well as the L3 nerve root I also performed a sublaminar decompression so I could see over to the contralateral side.  At the end of the decompression I packed this with Gelfoam and reposition for L4-5.  On the right side the scar tissue was dissected free and was very hard to find the margins of the residual laminotomy defect secondary to overgrowth of the bone and calcification.  So I started on the undersurface of the 5 lamina margin through the 5 lamina to get up to identify the L5 nerve root and the L5 pedicle then working and dissecting the L5 nerve root off the L5 pedicle working superiorly identified the disc base and a very large free fragment disc encased in scar causing severe thecal sac compression this was all removed with a nerve hook and pituitary rongeurs.  The space was then incised annulotomy was extended and cleaned out pituitary rongeurs and and a nerve hook.  At the discectomy was no further stenosis on thecal sac or right L5 nerve root.  My assistant was there for retraction at both levels through the entire case as well as assistant with a discectomy.  Wounds are copious irrigated to Kassim states that was maintained both incisions were then closed with interrupted Vicryl in a running 4-0 subcuticular Dermabond benzoin Steri-Strips and a sterile dressing was applied patient recovery in stable condition.  At the end the case all needle count sponge counts were correct.

## 2019-10-26 ENCOUNTER — Encounter (HOSPITAL_COMMUNITY): Payer: Self-pay | Admitting: Neurosurgery

## 2019-11-01 DIAGNOSIS — E1165 Type 2 diabetes mellitus with hyperglycemia: Secondary | ICD-10-CM | POA: Diagnosis not present

## 2019-11-01 DIAGNOSIS — Z6829 Body mass index (BMI) 29.0-29.9, adult: Secondary | ICD-10-CM | POA: Diagnosis not present

## 2020-01-26 DIAGNOSIS — Z0000 Encounter for general adult medical examination without abnormal findings: Secondary | ICD-10-CM | POA: Diagnosis not present

## 2020-01-26 DIAGNOSIS — E1165 Type 2 diabetes mellitus with hyperglycemia: Secondary | ICD-10-CM | POA: Diagnosis not present

## 2020-01-26 DIAGNOSIS — Z1331 Encounter for screening for depression: Secondary | ICD-10-CM | POA: Diagnosis not present

## 2020-01-26 DIAGNOSIS — I10 Essential (primary) hypertension: Secondary | ICD-10-CM | POA: Diagnosis not present

## 2020-01-26 DIAGNOSIS — E7800 Pure hypercholesterolemia, unspecified: Secondary | ICD-10-CM | POA: Diagnosis not present

## 2020-01-26 DIAGNOSIS — N400 Enlarged prostate without lower urinary tract symptoms: Secondary | ICD-10-CM | POA: Diagnosis not present

## 2020-01-26 DIAGNOSIS — Z6829 Body mass index (BMI) 29.0-29.9, adult: Secondary | ICD-10-CM | POA: Diagnosis not present

## 2020-01-26 DIAGNOSIS — Z79899 Other long term (current) drug therapy: Secondary | ICD-10-CM | POA: Diagnosis not present

## 2020-01-26 DIAGNOSIS — Z6830 Body mass index (BMI) 30.0-30.9, adult: Secondary | ICD-10-CM | POA: Diagnosis not present

## 2020-03-27 DIAGNOSIS — N62 Hypertrophy of breast: Secondary | ICD-10-CM | POA: Diagnosis not present

## 2020-03-27 DIAGNOSIS — I10 Essential (primary) hypertension: Secondary | ICD-10-CM | POA: Diagnosis not present

## 2020-03-27 DIAGNOSIS — E7800 Pure hypercholesterolemia, unspecified: Secondary | ICD-10-CM | POA: Diagnosis not present

## 2020-03-27 DIAGNOSIS — Z6830 Body mass index (BMI) 30.0-30.9, adult: Secondary | ICD-10-CM | POA: Diagnosis not present

## 2020-04-04 DIAGNOSIS — R922 Inconclusive mammogram: Secondary | ICD-10-CM | POA: Diagnosis not present

## 2020-04-04 DIAGNOSIS — N62 Hypertrophy of breast: Secondary | ICD-10-CM | POA: Diagnosis not present

## 2020-04-12 DIAGNOSIS — I10 Essential (primary) hypertension: Secondary | ICD-10-CM | POA: Diagnosis not present

## 2020-04-12 DIAGNOSIS — E7800 Pure hypercholesterolemia, unspecified: Secondary | ICD-10-CM | POA: Diagnosis not present

## 2020-05-13 DIAGNOSIS — I10 Essential (primary) hypertension: Secondary | ICD-10-CM | POA: Diagnosis not present

## 2020-05-13 DIAGNOSIS — E7800 Pure hypercholesterolemia, unspecified: Secondary | ICD-10-CM | POA: Diagnosis not present

## 2020-05-13 DIAGNOSIS — E1165 Type 2 diabetes mellitus with hyperglycemia: Secondary | ICD-10-CM | POA: Diagnosis not present

## 2020-06-28 DIAGNOSIS — G5622 Lesion of ulnar nerve, left upper limb: Secondary | ICD-10-CM | POA: Diagnosis not present

## 2020-06-28 DIAGNOSIS — Z6831 Body mass index (BMI) 31.0-31.9, adult: Secondary | ICD-10-CM | POA: Diagnosis not present

## 2020-06-28 DIAGNOSIS — I10 Essential (primary) hypertension: Secondary | ICD-10-CM | POA: Diagnosis not present

## 2020-07-25 DIAGNOSIS — M542 Cervicalgia: Secondary | ICD-10-CM | POA: Diagnosis not present

## 2020-07-25 DIAGNOSIS — M5412 Radiculopathy, cervical region: Secondary | ICD-10-CM | POA: Diagnosis not present

## 2020-08-01 DIAGNOSIS — G9589 Other specified diseases of spinal cord: Secondary | ICD-10-CM | POA: Diagnosis not present

## 2020-08-01 DIAGNOSIS — M4802 Spinal stenosis, cervical region: Secondary | ICD-10-CM | POA: Diagnosis not present

## 2020-08-01 DIAGNOSIS — M5412 Radiculopathy, cervical region: Secondary | ICD-10-CM | POA: Diagnosis not present

## 2020-08-01 DIAGNOSIS — M50323 Other cervical disc degeneration at C6-C7 level: Secondary | ICD-10-CM | POA: Diagnosis not present

## 2020-08-01 DIAGNOSIS — G9520 Unspecified cord compression: Secondary | ICD-10-CM | POA: Diagnosis not present

## 2020-08-06 DIAGNOSIS — M5412 Radiculopathy, cervical region: Secondary | ICD-10-CM | POA: Diagnosis not present

## 2020-08-08 ENCOUNTER — Other Ambulatory Visit: Payer: Self-pay | Admitting: Neurosurgery

## 2020-08-13 DIAGNOSIS — M4802 Spinal stenosis, cervical region: Secondary | ICD-10-CM | POA: Diagnosis not present

## 2020-08-16 NOTE — Progress Notes (Addendum)
Your procedure is scheduled on Wednesday September 8th from 12:13p.m - 2:41 p.m   Report to [facility] Main Entrance "A" at 10:10 A.M., and check in at the Admitting office.  Call this number if you have problems the morning of surgery: [phone]  Call [phone] if you have any questions prior to your surgery date Monday-Friday 8am-4pm   Remember: Do not eat or drink after midnight the night before your surgery    Take these medicines the morning of surgery with A SIP OF WATER: fexofenadine (ALLEGRA)  lovastatin (MEVACOR)   If needed: acetaminophen (TYLENOL 8 HOUR ARTHRITIS PAIN)  traMADol (ULTRAM)  As of today, STOP taking any Aspirin (unless otherwise instructed by your surgeon), Aleve, Naproxen, Ibuprofen, Motrin, Advil, Goody's, BC's, all herbal medications, fish oil, and all vitamins.   WHAT DO I DO ABOUT MY DIABETES MEDICATION?   . Do NOT take oral diabetes medicines (pills) the morning of surgery ----  (do NOT take metFORMIN (GLUCOPHAGE-XR)    HOW TO MANAGE YOUR DIABETES BEFORE AND AFTER SURGERY  Why is it important to control my blood sugar before and after surgery? . Improving blood sugar levels before and after surgery helps healing and can limit problems. . A way of improving blood sugar control is eating a healthy diet by: o  Eating less sugar and carbohydrates o  Increasing activity/exercise o  Talking with your doctor about reaching your blood sugar goals . High blood sugars (greater than 180 mg/dL) can raise your risk of infections and slow your recovery, so you will need to focus on controlling your diabetes during the weeks before surgery. . Make sure that the doctor who takes care of your diabetes knows about your planned surgery including the date and location.  How do I manage my blood sugar before surgery? . Check your blood sugar at least 4 times a day, starting 2 days before surgery, to make sure that the level is not too high or  low. . Check your blood sugar the morning of your surgery when you wake up and every 2 hours until you get to the Short Stay unit. o If your blood sugar is less than 70 mg/dL, you will need to treat for low blood sugar: - Do not take insulin. - Treat a low blood sugar (less than 70 mg/dL) with  cup of clear juice (cranberry or apple), 4 glucose tablets, OR glucose gel. - Recheck blood sugar in 15 minutes after treatment (to make sure it is greater than 70 mg/dL). If your blood sugar is not greater than 70 mg/dL on recheck, call [phone] for further instructions. . Report your blood sugar to the short stay nurse when you get to Short Stay.  . If you are admitted to the hospital after surgery: o Your blood sugar will be checked by the staff and you will probably be given insulin after surgery (instead of oral diabetes medicines) to make sure you have good blood sugar levels. o The goal for blood sugar control after surgery is 80-180 mg/dL.    The Morning of Surgery  Do not wear jewelry.  Do not wear lotions, powders, colognes, or deodorant  Men may shave face and neck.  Do not bring valuables to the hospital.  [facility] is not responsible for any belongings or valuables.  If you are a smoker, DO NOT Smoke 24 hours prior to surgery  If you wear a CPAP at night please bring your mask the morning of surgery  Remember that you must have someone to transport you home after your surgery, and remain with you for 24 hours if you are discharged the same day.   Please bring cases for contacts, glasses, hearing aids, dentures or bridgework because it cannot be worn into surgery.    Leave your suitcase in the car.  After surgery it may be brought to your room.  For patients admitted to the hospital, discharge time will be determined by your treatment team.  Patients discharged the day of surgery will not be allowed to drive home.    Special instructions:   [place]- Preparing For  Surgery  Before surgery, you can play an important role. Because skin is not sterile, your skin needs to be as free of germs as possible. You can reduce the number of germs on your skin by washing with CHG (chlorahexidine gluconate) Soap before surgery.  CHG is an antiseptic cleaner which kills germs and bonds with the skin to continue killing germs even after washing.    Oral Hygiene is also important to reduce your risk of infection.  Remember - BRUSH YOUR TEETH THE MORNING OF SURGERY WITH YOUR REGULAR TOOTHPASTE  Please do not use if you have an allergy to CHG or antibacterial soaps. If your skin becomes reddened/irritated stop using the CHG.  Do not shave (including legs and underarms) for at least 48 hours prior to first CHG shower. It is OK to shave your face.  Please follow these instructions carefully.   1. Shower the NIGHT BEFORE SURGERY and the MORNING OF SURGERY with CHG Soap.   2. If you chose to wash your hair and body, wash as usual with your normal shampoo and body-wash/soap.  3. Rinse your hair and body thoroughly to remove the shampoo and soap.  4. Apply CHG directly to the skin (ONLY FROM THE NECK DOWN) and wash gently with a scrungie or a clean washcloth.   5. Do not use on open wounds or open sores. Avoid contact with your eyes, ears, mouth and genitals (private parts). Wash Face and genitals (private parts)  with your normal soap.   6. Wash thoroughly, paying special attention to the area where your surgery will be performed.  7. Thoroughly rinse your body with warm water from the neck down.  8. DO NOT shower/wash with your normal soap after using and rinsing off the CHG Soap.  9. Pat yourself dry with a CLEAN TOWEL.  10. Wear CLEAN PAJAMAS to bed the night before surgery  11. Place CLEAN SHEETS on your bed the night of your first shower and DO NOT SLEEP WITH PETS.  12. Wear comfortable clothes the morning of surgery.     Day of Surgery:  Please shower the  morning of surgery with the CHG soap Do not apply any deodorants/lotions. Please wear clean clothes to the hospital/surgery center.   Remember to brush your teeth WITH YOUR REGULAR TOOTHPASTE.   Please read over the following fact sheets that you were given.

## 2020-08-20 ENCOUNTER — Other Ambulatory Visit: Payer: Self-pay

## 2020-08-20 ENCOUNTER — Encounter (HOSPITAL_COMMUNITY)
Admission: RE | Admit: 2020-08-20 | Discharge: 2020-08-20 | Disposition: A | Discharge status: Home / Self Care | Payer: PPO | Source: Ambulatory Visit | Attending: Neurosurgery | Admitting: Neurosurgery

## 2020-08-20 ENCOUNTER — Other Ambulatory Visit (HOSPITAL_COMMUNITY)
Admission: RE | Admit: 2020-08-20 | Discharge: 2020-08-20 | Disposition: A | Discharge status: Home / Self Care | Payer: PPO | Source: Ambulatory Visit | Attending: Neurosurgery | Admitting: Neurosurgery

## 2020-08-20 ENCOUNTER — Encounter (HOSPITAL_COMMUNITY): Payer: Self-pay

## 2020-08-20 DIAGNOSIS — Z01812 Encounter for preprocedural laboratory examination: Secondary | ICD-10-CM | POA: Insufficient documentation

## 2020-08-20 DIAGNOSIS — Z20822 Contact with and (suspected) exposure to covid-19: Secondary | ICD-10-CM | POA: Insufficient documentation

## 2020-08-20 LAB — COMPREHENSIVE METABOLIC PANEL
ALT: 25 U/L (ref 0–44)
AST: 15 U/L (ref 15–41)
Albumin: 4 g/dL (ref 3.5–5.0)
Alkaline Phosphatase: 57 U/L (ref 38–126)
Anion gap: 11 (ref 5–15)
BUN: 14 mg/dL (ref 8–23)
CO2: 25 mmol/L (ref 22–32)
Calcium: 9.4 mg/dL (ref 8.9–10.3)
Chloride: 100 mmol/L (ref 98–111)
Creatinine, Ser: 1.05 mg/dL (ref 0.61–1.24)
GFR calc Af Amer: >60 mL/min (ref >60)
GFR calc non Af Amer: >60 mL/min (ref >60)
Glucose, Bld: 159 mg/dL — ABNORMAL HIGH (ref 70–99)
Potassium: 4.7 mmol/L (ref 3.5–5.1)
Sodium: 136 mmol/L (ref 135–145)
Total Bilirubin: 1 mg/dL (ref 0.3–1.2)
Total Protein: 7.6 g/dL (ref 6.5–8.1)

## 2020-08-20 LAB — CBC
HCT: 41.5 % (ref 39.0–52.0)
Hemoglobin: 13.7 g/dL (ref 13.0–17.0)
MCH: 30.6 pg (ref 26.0–34.0)
MCHC: 33 g/dL (ref 30.0–36.0)
MCV: 92.6 fL (ref 80.0–100.0)
Platelets: 241 10*3/uL (ref 150–400)
RBC: 4.48 MIL/uL (ref 4.22–5.81)
RDW: 12.9 % (ref 11.5–15.5)
WBC: 11.4 10*3/uL — ABNORMAL HIGH (ref 4.0–10.5)
nRBC: 0 % (ref 0.0–0.2)

## 2020-08-20 LAB — HEMOGLOBIN A1C
Hgb A1c MFr Bld: 7.5 % — ABNORMAL HIGH (ref 4.8–5.6)
Mean Plasma Glucose: 168.55 mg/dL

## 2020-08-20 LAB — SURGICAL PCR SCREEN
MRSA, PCR: NEGATIVE
Staphylococcus aureus: NEGATIVE

## 2020-08-20 LAB — GLUCOSE, CAPILLARY: Glucose-Capillary: 196 mg/dL — ABNORMAL HIGH (ref 70–99)

## 2020-08-20 LAB — SARS CORONAVIRUS 2 (TAT 6-24 HRS): SARS Coronavirus 2: NEGATIVE

## 2020-08-20 NOTE — Progress Notes (Signed)
Anesthesia Chart Review:  Case: [id number] Date/Time: 08/21/20 1158   Procedure: ACDF - C4-C5 - C5-C6 (N/A ) - 3C   Anesthesia type: General   Pre-op diagnosis: Stenosis   Location: [facility] ROOM 18 / [facility]   Surgeons: Kary Kos, MD      DISCUSSION: Patient is a 69 year old male scheduled for the above procedure.  History includes former smoker, chronic back pain, DM2, GERD, cervical fusion 1994. S/p decompressive L-23 laminectomy, redo L4-5 microdiscectomy L4-5 10/24/09. BMI is consistent with obesity.   08/20/2020 presurgical COVID-19 test is in process.  He denied shortness of breath, cough, fever, chest pain at PAT RN visit.  Anesthesia team to evaluate on the day of surgery.   VS: BP (!) 148/83   Pulse 94   Temp 37.1 C (Oral)   Resp 20   Ht 6' (1.829 m)   Wt 102.1 kg   SpO2 98%   BMI 30.52 kg/m   PROVIDERS: Ronita Hipps, MD is PCP    LABS: Labs reviewed: Acceptable for surgery. A1c 7.5%, down from 8.2% on 10/23/19. (all labs ordered are listed, but only abnormal results are displayed)  Labs Reviewed  GLUCOSE, CAPILLARY - Abnormal; Notable for the following components:      Result Value   Glucose-Capillary 196 (*)    All other components within normal limits  HEMOGLOBIN A1C - Abnormal; Notable for the following components:   Hgb A1c MFr Bld 7.5 (*)    All other components within normal limits  COMPREHENSIVE METABOLIC PANEL - Abnormal; Notable for the following components:   Glucose, Bld 159 (*)    All other components within normal limits  CBC - Abnormal; Notable for the following components:   WBC 11.4 (*)    All other components within normal limits  SURGICAL PCR SCREEN     EKG: 10/23/19: NSR   CV: N/A (Remote history of a stress test > 40 years ago)   Past Medical History:  Diagnosis Date  . Chronic back pain   . Diabetes mellitus without complication ([place])   . GERD (gastroesophageal reflux disease)   . Hyperlipidemia   . Hypertension   . Left  hydrocele   . Lumbar stenosis   . Wears glasses     Past Surgical History:  Procedure Laterality Date  . BACK SURGERY    . CERVICAL FUSION  1994   C6 -- C7  . EYE SURGERY Bilateral    cataract removal; 06/03/15, 07/29/15  . HYDROCELE EXCISION Left 12/11/2013   Procedure: LEFT HYDROCELECTOMY ADULT;  Surgeon: Claybon Jabs, MD;  [facility]  Service: Urology;  Laterality: Left;  . LUMBAR LAMINECTOMY/DECOMPRESSION MICRODISCECTOMY Bilateral 10/25/2019   Procedure: Microdiscectomy - left - Lumbar two-three - right - Lumbar four-Lumbar five redo;  Surgeon: Kary Kos, MD;  Location: [facility];  Service: Neurosurgery;  Laterality: Bilateral;  . WRIST GANGLION EXCISION Left 2004    MEDICATIONS: . acetaminophen (TYLENOL 8 HOUR ARTHRITIS PAIN) 650 MG CR tablet  . diphenhydrAMINE (BENADRYL) 25 MG tablet  . enalapril (VASOTEC) 10 MG tablet  . fexofenadine (ALLEGRA) 180 MG tablet  . lovastatin (MEVACOR) 40 MG tablet  . metFORMIN (GLUCOPHAGE-XR) 500 MG 24 hr tablet  . tamsulosin (FLOMAX) 0.4 MG CAPS capsule  . traMADol (ULTRAM) 50 MG tablet  . triamcinolone (NASACORT ALLERGY 24HR) 55 MCG/ACT AERO nasal inhaler   No current facility-administered medications for this encounter.    Myra Gianotti, PA-C Surgical Short Stay/Anesthesiology [facility]  Phone [phone] [facility] Phone [phone] 08/20/2020 12:13 PM

## 2020-08-20 NOTE — Progress Notes (Addendum)
PCP - Anne Ng. Helene Kelp, MD Cardiologist - Denies  PPM/ICD - Denies  Chest x-ray - N/A EKG - 10/25/19 Stress Test - Per patient, > 40 years ago, done to r/o CP; cause of CP was acid reflux. ECHO - Denies Cardiac Cath - Denies  Sleep Study - Denies  Fasting Blood Sugar: 130 Checks Blood Sugar 2 times a week.  Blood Thinner Instructions: N/A Aspirin Instructions: N/A  ERAS Protcol - N/A PRE-SURGERY Ensure or G2- N/A  COVID TEST- 08/20/20   Anesthesia review: No.  Patient denies shortness of breath, fever, cough and chest pain at PAT appointment   All instructions explained to the patient, with a verbal understanding of the material. Patient agrees to go over the instructions while at home for a better understanding. Patient also instructed to self quarantine after being tested for COVID-19. The opportunity to ask questions was provided.

## 2020-08-20 NOTE — Anesthesia Preprocedure Evaluation (Addendum)
Anesthesia Evaluation  Patient identified by MRN, date of birth, ID band Patient awake    Reviewed: Allergy & Precautions, NPO status , Patient's Chart, lab work & pertinent test results  Airway Mallampati: III  TM Distance: >3 FB Neck ROM: Full    Dental no notable dental hx. (+) Dental Advisory Given   Pulmonary neg pulmonary ROS, former smoker,    Pulmonary exam normal        Cardiovascular hypertension, Pt. on medications Normal cardiovascular exam     Neuro/Psych negative neurological ROS  negative psych ROS   GI/Hepatic negative GI ROS, Neg liver ROS,   Endo/Other  diabetes  Renal/GU negative Renal ROS  negative genitourinary   Musculoskeletal negative musculoskeletal ROS (+)   Abdominal   Peds negative pediatric ROS (+)  Hematology negative hematology ROS (+)   Anesthesia Other Findings   Reproductive/Obstetrics negative OB ROS                            Anesthesia Physical Anesthesia Plan  ASA: II  Anesthesia Plan: General   Post-op Pain Management:    Induction: Intravenous  PONV Risk Score and Plan: 4 or greater and Ondansetron, Dexamethasone and Treatment may vary due to age or medical condition  Airway Management Planned: Oral ETT  Additional Equipment:   Intra-op Plan:   Post-operative Plan: Extubation in OR  Informed Consent: I have reviewed the patients History and Physical, chart, labs and discussed the procedure including the risks, benefits and alternatives for the proposed anesthesia with the patient or authorized representative who has indicated his/her understanding and acceptance.     Dental advisory given  Plan Discussed with: Anesthesiologist  Anesthesia Plan Comments: (PAT note written 08/20/2020 by Myra Gianotti, PA-C. )       Anesthesia Quick Evaluation

## 2020-08-20 NOTE — Progress Notes (Signed)
Your procedure is scheduled on Wednesday September 8th from 12:13p.m - 2:41 p.m   Report to [facility] Main Entrance "A" at 10:10 A.M., and check in at the Admitting office.  Call this number if you have problems the morning of surgery: [phone]   Remember: Do not eat or drink after midnight the night before your surgery    Take these medicines the morning of surgery with A SIP OF WATER: fexofenadine (ALLEGRA)  lovastatin (MEVACOR) tamsulosin (FLOMAX)   If needed: acetaminophen (TYLENOL 8 HOUR ARTHRITIS PAIN)  traMADol (ULTRAM)  As of today, STOP taking any Aspirin (unless otherwise instructed by your surgeon), Aleve, Naproxen, Ibuprofen, Motrin, Advil, Goody's, BC's, all herbal medications, fish oil, and all vitamins.   WHAT DO I DO ABOUT MY DIABETES MEDICATION?   . Do NOT take oral diabetes medicines (pills) the morning of surgery ----  (do NOT take metFORMIN (GLUCOPHAGE-XR)    HOW TO MANAGE YOUR DIABETES BEFORE AND AFTER SURGERY  Why is it important to control my blood sugar before and after surgery? . Improving blood sugar levels before and after surgery helps healing and can limit problems. . A way of improving blood sugar control is eating a healthy diet by: o  Eating less sugar and carbohydrates o  Increasing activity/exercise o  Talking with your doctor about reaching your blood sugar goals . High blood sugars (greater than 180 mg/dL) can raise your risk of infections and slow your recovery, so you will need to focus on controlling your diabetes during the weeks before surgery. . Make sure that the doctor who takes care of your diabetes knows about your planned surgery including the date and location.  How do I manage my blood sugar before surgery? . Check your blood sugar at least 4 times a day, starting 2 days before surgery, to make sure that the level is not too high or low. . Check your blood sugar the morning of your surgery when you wake up and every  2 hours until you get to the Short Stay unit. o If your blood sugar is less than 70 mg/dL, you will need to treat for low blood sugar: - Do not take insulin. - Treat a low blood sugar (less than 70 mg/dL) with  cup of clear juice (cranberry or apple), 4 glucose tablets, OR glucose gel. - Recheck blood sugar in 15 minutes after treatment (to make sure it is greater than 70 mg/dL). If your blood sugar is not greater than 70 mg/dL on recheck, call [phone] for further instructions. . Report your blood sugar to the short stay nurse when you get to Short Stay.  . If you are admitted to the hospital after surgery: o Your blood sugar will be checked by the staff and you will probably be given insulin after surgery (instead of oral diabetes medicines) to make sure you have good blood sugar levels. o The goal for blood sugar control after surgery is 80-180 mg/dL.    The Morning of Surgery  Do not wear jewelry.  Do not wear lotions, powders, colognes, or deodorant  Men may shave face and neck.  Do not bring valuables to the hospital.  [facility] is not responsible for any belongings or valuables.  If you are a smoker, DO NOT Smoke 24 hours prior to surgery  If you wear a CPAP at night please bring your mask the morning of surgery   Remember that you must have someone to transport you home after  your surgery, and remain with you for 24 hours if you are discharged the same day.   Please bring cases for contacts, glasses, hearing aids, dentures or bridgework because it cannot be worn into surgery.    Leave your suitcase in the car.  After surgery it may be brought to your room.  For patients admitted to the hospital, discharge time will be determined by your treatment team.  Patients discharged the day of surgery will not be allowed to drive home.    Special instructions:   [facility]- Preparing For Surgery  Before surgery, you can play an important role. Because skin is not sterile,  your skin needs to be as free of germs as possible. You can reduce the number of germs on your skin by washing with CHG (chlorahexidine gluconate) Soap before surgery.  CHG is an antiseptic cleaner which kills germs and bonds with the skin to continue killing germs even after washing.    Oral Hygiene is also important to reduce your risk of infection.  Remember - BRUSH YOUR TEETH THE MORNING OF SURGERY WITH YOUR REGULAR TOOTHPASTE  Please do not use if you have an allergy to CHG or antibacterial soaps. If your skin becomes reddened/irritated stop using the CHG.  Do not shave (including legs and underarms) for at least 48 hours prior to first CHG shower. It is OK to shave your face.  Please follow these instructions carefully.   1. Shower the NIGHT BEFORE SURGERY and the MORNING OF SURGERY with CHG Soap.   2. If you chose to wash your hair and body, wash as usual with your normal shampoo and body-wash/soap.  3. Rinse your hair and body thoroughly to remove the shampoo and soap.  4. Apply CHG directly to the skin (ONLY FROM THE NECK DOWN) and wash gently with a scrungie or a clean washcloth.   5. Do not use on open wounds or open sores. Avoid contact with your eyes, ears, mouth and genitals (private parts). Wash Face and genitals (private parts)  with your normal soap.   6. Wash thoroughly, paying special attention to the area where your surgery will be performed.  7. Thoroughly rinse your body with warm water from the neck down.  8. DO NOT shower/wash with your normal soap after using and rinsing off the CHG Soap.  9. Pat yourself dry with a CLEAN TOWEL.  10. Wear CLEAN PAJAMAS to bed the night before surgery  11. Place CLEAN SHEETS on your bed the night of your first shower and DO NOT SLEEP WITH PETS.  12. Wear comfortable clothes the morning of surgery.     Day of Surgery:  Please shower the morning of surgery with the CHG soap Do not apply any deodorants/lotions. Please wear  clean clothes to the hospital/surgery center.   Remember to brush your teeth WITH YOUR REGULAR TOOTHPASTE.   Please read over the following fact sheets that you were given.

## 2020-08-21 ENCOUNTER — Encounter (HOSPITAL_COMMUNITY): Payer: Self-pay | Admitting: Neurosurgery

## 2020-08-21 ENCOUNTER — Encounter (HOSPITAL_COMMUNITY)
Admission: RE | Disposition: A | Discharge status: Home / Self Care | Payer: Self-pay | Source: Home / Self Care | Attending: Neurosurgery

## 2020-08-21 ENCOUNTER — Other Ambulatory Visit: Payer: Self-pay

## 2020-08-21 ENCOUNTER — Ambulatory Visit (HOSPITAL_COMMUNITY): Payer: PPO

## 2020-08-21 ENCOUNTER — Ambulatory Visit (HOSPITAL_COMMUNITY): Payer: PPO | Admitting: Vascular Surgery

## 2020-08-21 ENCOUNTER — Ambulatory Visit (HOSPITAL_COMMUNITY): Payer: PPO | Admitting: Certified Registered Nurse Anesthetist

## 2020-08-21 ENCOUNTER — Ambulatory Visit (HOSPITAL_COMMUNITY)
Admission: RE | Admit: 2020-08-21 | Discharge: 2020-08-22 | Disposition: A | Discharge status: Home / Self Care | Payer: PPO | Attending: Neurosurgery | Admitting: Neurosurgery

## 2020-08-21 DIAGNOSIS — E119 Type 2 diabetes mellitus without complications: Secondary | ICD-10-CM | POA: Diagnosis not present

## 2020-08-21 DIAGNOSIS — Z881 Allergy status to other antibiotic agents status: Secondary | ICD-10-CM | POA: Diagnosis not present

## 2020-08-21 DIAGNOSIS — M50122 Cervical disc disorder at C5-C6 level with radiculopathy: Secondary | ICD-10-CM | POA: Diagnosis not present

## 2020-08-21 DIAGNOSIS — Z87891 Personal history of nicotine dependence: Secondary | ICD-10-CM | POA: Diagnosis not present

## 2020-08-21 DIAGNOSIS — M5000 Cervical disc disorder with myelopathy, unspecified cervical region: Secondary | ICD-10-CM | POA: Diagnosis not present

## 2020-08-21 DIAGNOSIS — M50221 Other cervical disc displacement at C4-C5 level: Secondary | ICD-10-CM | POA: Diagnosis not present

## 2020-08-21 DIAGNOSIS — Z7984 Long term (current) use of oral hypoglycemic drugs: Secondary | ICD-10-CM | POA: Diagnosis not present

## 2020-08-21 DIAGNOSIS — Z419 Encounter for procedure for purposes other than remedying health state, unspecified: Secondary | ICD-10-CM

## 2020-08-21 DIAGNOSIS — M50121 Cervical disc disorder at C4-C5 level with radiculopathy: Secondary | ICD-10-CM | POA: Diagnosis not present

## 2020-08-21 DIAGNOSIS — M50022 Cervical disc disorder at C5-C6 level with myelopathy: Secondary | ICD-10-CM | POA: Diagnosis not present

## 2020-08-21 DIAGNOSIS — E785 Hyperlipidemia, unspecified: Secondary | ICD-10-CM | POA: Diagnosis not present

## 2020-08-21 DIAGNOSIS — M2578 Osteophyte, vertebrae: Secondary | ICD-10-CM | POA: Diagnosis not present

## 2020-08-21 DIAGNOSIS — I10 Essential (primary) hypertension: Secondary | ICD-10-CM | POA: Insufficient documentation

## 2020-08-21 DIAGNOSIS — M4802 Spinal stenosis, cervical region: Secondary | ICD-10-CM | POA: Insufficient documentation

## 2020-08-21 DIAGNOSIS — Z79899 Other long term (current) drug therapy: Secondary | ICD-10-CM | POA: Diagnosis not present

## 2020-08-21 DIAGNOSIS — M4722 Other spondylosis with radiculopathy, cervical region: Secondary | ICD-10-CM | POA: Diagnosis not present

## 2020-08-21 DIAGNOSIS — Z981 Arthrodesis status: Secondary | ICD-10-CM | POA: Diagnosis not present

## 2020-08-21 HISTORY — PX: ANTERIOR CERVICAL DECOMP/DISCECTOMY FUSION: SHX1161

## 2020-08-21 LAB — GLUCOSE, CAPILLARY
Glucose-Capillary: 144 mg/dL — ABNORMAL HIGH (ref 70–99)
Glucose-Capillary: 168 mg/dL — ABNORMAL HIGH (ref 70–99)
Glucose-Capillary: 258 mg/dL — ABNORMAL HIGH (ref 70–99)
Glucose-Capillary: 275 mg/dL — ABNORMAL HIGH (ref 70–99)

## 2020-08-21 SURGERY — ANTERIOR CERVICAL DECOMPRESSION/DISCECTOMY FUSION 2 LEVELS
Anesthesia: General | Site: Neck

## 2020-08-21 MED ORDER — 0.9 % SODIUM CHLORIDE (POUR BTL) OPTIME
TOPICAL | Status: DC | PRN
  Administered 2020-08-21 (×2): 1000 mL

## 2020-08-21 MED ORDER — THROMBIN 5000 UNITS EX SOLR
CUTANEOUS | Status: DC | PRN
  Administered 2020-08-21 (×2): 5000 [IU] via TOPICAL

## 2020-08-21 MED ORDER — DEXAMETHASONE SODIUM PHOSPHATE 10 MG/ML IJ SOLN
INTRAMUSCULAR | Status: AC
  Filled 2020-08-21: qty 1

## 2020-08-21 MED ORDER — PROPOFOL 10 MG/ML IV BOLUS
INTRAVENOUS | Status: DC | PRN
  Administered 2020-08-21: 200 mg via INTRAVENOUS

## 2020-08-21 MED ORDER — ACETAMINOPHEN 325 MG PO TABS: 650.0000 mg | ORAL_TABLET | ORAL | Status: DC | PRN

## 2020-08-21 MED ORDER — SUGAMMADEX SODIUM 200 MG/2ML IV SOLN
INTRAVENOUS | Status: DC | PRN
  Administered 2020-08-21: 200 mg via INTRAVENOUS

## 2020-08-21 MED ORDER — CHLORHEXIDINE GLUCONATE CLOTH 2 % EX PADS: 6.0000 | MEDICATED_PAD | Freq: Once | CUTANEOUS | Status: DC

## 2020-08-21 MED ORDER — ENALAPRIL MALEATE 5 MG PO TABS
10.0000 mg | ORAL_TABLET | Freq: Every day | ORAL | Status: DC
  Administered 2020-08-21: 10 mg via ORAL
  Filled 2020-08-21 (×2): qty 2

## 2020-08-21 MED ORDER — PANTOPRAZOLE SODIUM 40 MG PO TBEC
40.0000 mg | DELAYED_RELEASE_TABLET | Freq: Every day | ORAL | Status: DC
  Administered 2020-08-21 – 2020-08-22 (×2): 40 mg via ORAL
  Filled 2020-08-21 (×2): qty 1

## 2020-08-21 MED ORDER — THROMBIN 5000 UNITS EX SOLR: OROMUCOSAL | Status: DC | PRN

## 2020-08-21 MED ORDER — LIDOCAINE 2% (20 MG/ML) 5 ML SYRINGE
INTRAMUSCULAR | Status: DC | PRN
  Administered 2020-08-21: 100 mg via INTRAVENOUS

## 2020-08-21 MED ORDER — CEFAZOLIN SODIUM-DEXTROSE 2-4 GM/100ML-% IV SOLN
INTRAVENOUS | Status: AC
  Filled 2020-08-21: qty 100

## 2020-08-21 MED ORDER — PANTOPRAZOLE SODIUM 40 MG IV SOLR: 40.0000 mg | Freq: Every day | INTRAVENOUS | Status: DC

## 2020-08-21 MED ORDER — CHLORHEXIDINE GLUCONATE 0.12 % MT SOLN
15.0000 mL | Freq: Once | OROMUCOSAL | Status: AC
  Administered 2020-08-21: 15 mL via OROMUCOSAL
  Filled 2020-08-21: qty 15

## 2020-08-21 MED ORDER — CELECOXIB 200 MG PO CAPS
ORAL_CAPSULE | ORAL | Status: AC
  Filled 2020-08-21: qty 1

## 2020-08-21 MED ORDER — LORATADINE 10 MG PO TABS: 10.0000 mg | ORAL_TABLET | Freq: Every day | ORAL | Status: DC

## 2020-08-21 MED ORDER — MIDAZOLAM HCL 2 MG/2ML IJ SOLN
INTRAMUSCULAR | Status: DC | PRN
  Administered 2020-08-21: 2 mg via INTRAVENOUS

## 2020-08-21 MED ORDER — INSULIN ASPART 100 UNIT/ML ~~LOC~~ SOLN
0.0000 [IU] | Freq: Every day | SUBCUTANEOUS | Status: DC
  Administered 2020-08-21: 3 [IU] via SUBCUTANEOUS

## 2020-08-21 MED ORDER — PHENYLEPHRINE 40 MCG/ML (10ML) SYRINGE FOR IV PUSH (FOR BLOOD PRESSURE SUPPORT)
PREFILLED_SYRINGE | INTRAVENOUS | Status: DC | PRN
  Administered 2020-08-21: 120 ug via INTRAVENOUS

## 2020-08-21 MED ORDER — HYDROCODONE-ACETAMINOPHEN 5-325 MG PO TABS
1.0000 | ORAL_TABLET | ORAL | Status: DC | PRN
  Administered 2020-08-21 – 2020-08-22 (×4): 1 via ORAL
  Filled 2020-08-21 (×4): qty 1

## 2020-08-21 MED ORDER — ACETAMINOPHEN ER 650 MG PO TBCR: 1300.0000 mg | EXTENDED_RELEASE_TABLET | Freq: Three times a day (TID) | ORAL | Status: DC | PRN

## 2020-08-21 MED ORDER — TRAMADOL HCL 50 MG PO TABS: 50.0000 mg | ORAL_TABLET | Freq: Four times a day (QID) | ORAL | Status: DC | PRN

## 2020-08-21 MED ORDER — CEFAZOLIN SODIUM-DEXTROSE 2-4 GM/100ML-% IV SOLN
2.0000 g | Freq: Three times a day (TID) | INTRAVENOUS | Status: AC
  Administered 2020-08-21 – 2020-08-22 (×2): 2 g via INTRAVENOUS
  Filled 2020-08-21 (×2): qty 100

## 2020-08-21 MED ORDER — DEXAMETHASONE SODIUM PHOSPHATE 10 MG/ML IJ SOLN
INTRAMUSCULAR | Status: DC | PRN
  Administered 2020-08-21: 10 mg via INTRAVENOUS

## 2020-08-21 MED ORDER — ONDANSETRON HCL 4 MG/2ML IJ SOLN
INTRAMUSCULAR | Status: DC | PRN
  Administered 2020-08-21: 4 mg via INTRAVENOUS

## 2020-08-21 MED ORDER — INSULIN ASPART 100 UNIT/ML ~~LOC~~ SOLN
0.0000 [IU] | Freq: Three times a day (TID) | SUBCUTANEOUS | Status: DC
  Administered 2020-08-22: 3 [IU] via SUBCUTANEOUS

## 2020-08-21 MED ORDER — TAMSULOSIN HCL 0.4 MG PO CAPS
0.4000 mg | ORAL_CAPSULE | Freq: Every day | ORAL | Status: DC
  Administered 2020-08-21: 0.4 mg via ORAL
  Filled 2020-08-21: qty 1

## 2020-08-21 MED ORDER — ONDANSETRON HCL 4 MG/2ML IJ SOLN: 4.0000 mg | Freq: Four times a day (QID) | INTRAMUSCULAR | Status: DC | PRN

## 2020-08-21 MED ORDER — SODIUM CHLORIDE 0.9 % IV SOLN: 250.0000 mL | INTRAVENOUS | Status: DC

## 2020-08-21 MED ORDER — ONDANSETRON HCL 4 MG/2ML IJ SOLN
INTRAMUSCULAR | Status: AC
  Filled 2020-08-21: qty 2

## 2020-08-21 MED ORDER — FENTANYL CITRATE (PF) 250 MCG/5ML IJ SOLN
INTRAMUSCULAR | Status: DC | PRN
Start: 2020-08-21 — End: 2020-08-21
  Administered 2020-08-21: 50 ug via INTRAVENOUS
  Administered 2020-08-21: 100 ug via INTRAVENOUS

## 2020-08-21 MED ORDER — SUCCINYLCHOLINE CHLORIDE 200 MG/10ML IV SOSY
PREFILLED_SYRINGE | INTRAVENOUS | Status: DC | PRN
  Administered 2020-08-21: 160 mg via INTRAVENOUS

## 2020-08-21 MED ORDER — MIDAZOLAM HCL 2 MG/2ML IJ SOLN
INTRAMUSCULAR | Status: AC
  Filled 2020-08-21: qty 2

## 2020-08-21 MED ORDER — SODIUM CHLORIDE 0.9% FLUSH
3.0000 mL | Freq: Two times a day (BID) | INTRAVENOUS | Status: DC
  Administered 2020-08-21: 3 mL via INTRAVENOUS

## 2020-08-21 MED ORDER — PHENYLEPHRINE 40 MCG/ML (10ML) SYRINGE FOR IV PUSH (FOR BLOOD PRESSURE SUPPORT)
PREFILLED_SYRINGE | INTRAVENOUS | Status: AC
  Filled 2020-08-21: qty 10

## 2020-08-21 MED ORDER — FENTANYL CITRATE (PF) 100 MCG/2ML IJ SOLN: 25.0000 ug | INTRAMUSCULAR | Status: DC | PRN

## 2020-08-21 MED ORDER — SODIUM CHLORIDE 0.9 % IV SOLN: INTRAVENOUS | Status: DC | PRN

## 2020-08-21 MED ORDER — CYCLOBENZAPRINE HCL 10 MG PO TABS
10.0000 mg | ORAL_TABLET | Freq: Three times a day (TID) | ORAL | Status: DC | PRN
  Administered 2020-08-21 – 2020-08-22 (×2): 10 mg via ORAL
  Filled 2020-08-21: qty 1

## 2020-08-21 MED ORDER — CELECOXIB 200 MG PO CAPS
200.0000 mg | ORAL_CAPSULE | Freq: Once | ORAL | Status: AC
  Administered 2020-08-21: 200 mg via ORAL

## 2020-08-21 MED ORDER — SODIUM CHLORIDE 0.9% FLUSH: 3.0000 mL | INTRAVENOUS | Status: DC | PRN

## 2020-08-21 MED ORDER — FENTANYL CITRATE (PF) 250 MCG/5ML IJ SOLN
INTRAMUSCULAR | Status: AC
Start: 2020-08-21 — End: ?
  Filled 2020-08-21: qty 5

## 2020-08-21 MED ORDER — ACETAMINOPHEN 650 MG RE SUPP: 650.0000 mg | RECTAL | Status: DC | PRN

## 2020-08-21 MED ORDER — HEMOSTATIC AGENTS (NO CHARGE) OPTIME
TOPICAL | Status: DC | PRN
  Administered 2020-08-21: 1 via TOPICAL

## 2020-08-21 MED ORDER — LACTATED RINGERS IV SOLN: INTRAVENOUS | Status: DC

## 2020-08-21 MED ORDER — ONDANSETRON HCL 4 MG PO TABS: 4.0000 mg | ORAL_TABLET | Freq: Four times a day (QID) | ORAL | Status: DC | PRN

## 2020-08-21 MED ORDER — PROPOFOL 10 MG/ML IV BOLUS
INTRAVENOUS | Status: AC
  Filled 2020-08-21: qty 40

## 2020-08-21 MED ORDER — CEFAZOLIN SODIUM-DEXTROSE 2-4 GM/100ML-% IV SOLN
2.0000 g | INTRAVENOUS | Status: AC
  Administered 2020-08-21: 2 g via INTRAVENOUS

## 2020-08-21 MED ORDER — ALUM & MAG HYDROXIDE-SIMETH 200-200-20 MG/5ML PO SUSP: 30.0000 mL | Freq: Four times a day (QID) | ORAL | Status: DC | PRN

## 2020-08-21 MED ORDER — LIDOCAINE 2% (20 MG/ML) 5 ML SYRINGE
INTRAMUSCULAR | Status: AC
  Filled 2020-08-21: qty 5

## 2020-08-21 MED ORDER — DEXAMETHASONE SODIUM PHOSPHATE 10 MG/ML IJ SOLN: 10.0000 mg | Freq: Once | INTRAMUSCULAR | Status: DC

## 2020-08-21 MED ORDER — PHENOL 1.4 % MT LIQD: 1.0000 | OROMUCOSAL | Status: DC | PRN

## 2020-08-21 MED ORDER — ACETAMINOPHEN 500 MG PO TABS
ORAL_TABLET | ORAL | Status: AC
  Filled 2020-08-21: qty 2

## 2020-08-21 MED ORDER — HYDROMORPHONE HCL 1 MG/ML IJ SOLN
0.5000 mg | INTRAMUSCULAR | Status: DC | PRN
  Administered 2020-08-21: 0.5 mg via INTRAVENOUS
  Filled 2020-08-21: qty 0.5

## 2020-08-21 MED ORDER — THROMBIN 5000 UNITS EX SOLR
CUTANEOUS | Status: AC
  Filled 2020-08-21: qty 15000

## 2020-08-21 MED ORDER — PHENYLEPHRINE HCL-NACL 10-0.9 MG/250ML-% IV SOLN
INTRAVENOUS | Status: DC | PRN
  Administered 2020-08-21: 25 ug/min via INTRAVENOUS

## 2020-08-21 MED ORDER — PROMETHAZINE HCL 25 MG/ML IJ SOLN: 6.2500 mg | INTRAMUSCULAR | Status: DC | PRN

## 2020-08-21 MED ORDER — ROCURONIUM BROMIDE 10 MG/ML (PF) SYRINGE
PREFILLED_SYRINGE | INTRAVENOUS | Status: DC | PRN
  Administered 2020-08-21: 100 mg via INTRAVENOUS

## 2020-08-21 MED ORDER — ACETAMINOPHEN 500 MG PO TABS
1000.0000 mg | ORAL_TABLET | Freq: Once | ORAL | Status: AC
  Administered 2020-08-21: 1000 mg via ORAL

## 2020-08-21 MED ORDER — MENTHOL 3 MG MT LOZG: 1.0000 | LOZENGE | OROMUCOSAL | Status: DC | PRN

## 2020-08-21 MED ORDER — ORAL CARE MOUTH RINSE: 15.0000 mL | Freq: Once | OROMUCOSAL | Status: AC

## 2020-08-21 MED ORDER — TRIAMCINOLONE ACETONIDE 55 MCG/ACT NA AERO
1.0000 | INHALATION_SPRAY | Freq: Every day | NASAL | Status: DC | PRN
  Filled 2020-08-21: qty 10.8

## 2020-08-21 MED ORDER — ROCURONIUM BROMIDE 10 MG/ML (PF) SYRINGE
PREFILLED_SYRINGE | INTRAVENOUS | Status: AC
  Filled 2020-08-21: qty 10

## 2020-08-21 MED ORDER — PRAVASTATIN SODIUM 10 MG PO TABS
10.0000 mg | ORAL_TABLET | Freq: Every day | ORAL | Status: DC
  Administered 2020-08-21: 10 mg via ORAL
  Filled 2020-08-21: qty 1

## 2020-08-21 MED ORDER — METFORMIN HCL ER 500 MG PO TB24
500.0000 mg | ORAL_TABLET | Freq: Two times a day (BID) | ORAL | Status: DC
  Administered 2020-08-21 – 2020-08-22 (×2): 500 mg via ORAL
  Filled 2020-08-21 (×3): qty 1

## 2020-08-21 MED ORDER — DIPHENHYDRAMINE HCL 25 MG PO CAPS
50.0000 mg | ORAL_CAPSULE | Freq: Every day | ORAL | Status: DC
  Administered 2020-08-21: 50 mg via ORAL
  Filled 2020-08-21: qty 2

## 2020-08-21 MED ORDER — CYCLOBENZAPRINE HCL 10 MG PO TABS
ORAL_TABLET | ORAL | Status: AC
  Filled 2020-08-21: qty 1

## 2020-08-21 SURGICAL SUPPLY — 68 items
APL SKNCLS STERI-STRIP NONHPOA (GAUZE/BANDAGES/DRESSINGS) ×1
BAG DECANTER FOR FLEXI CONT (MISCELLANEOUS) ×3 IMPLANT
BAND INSRT 18 STRL LF DISP RB (MISCELLANEOUS) ×2
BAND RUBBER #18 3X1/16 STRL (MISCELLANEOUS) ×6 IMPLANT
BASKET BONE COLLECTION (BASKET) ×3 IMPLANT
BENZOIN TINCTURE PRP APPL 2/3 (GAUZE/BANDAGES/DRESSINGS) ×3 IMPLANT
BIT DRILL NEURO 2X3.1 SFT TUCH (MISCELLANEOUS) ×1 IMPLANT
BONE VIVIGEN FORMABLE 1.3CC (Bone Implant) ×3 IMPLANT
BUR MATCHSTICK NEURO 3.0 LAGG (BURR) ×3 IMPLANT
CANISTER SUCT 3000ML PPV (MISCELLANEOUS) ×3 IMPLANT
CARTRIDGE OIL MAESTRO DRILL (MISCELLANEOUS) ×1 IMPLANT
CLOSURE WOUND 1/2 X4 (GAUZE/BANDAGES/DRESSINGS) ×1
COVER WAND RF STERILE (DRAPES) IMPLANT
DERMABOND ADVANCED (GAUZE/BANDAGES/DRESSINGS) ×2
DERMABOND ADVANCED .7 DNX12 (GAUZE/BANDAGES/DRESSINGS) ×1 IMPLANT
DIFFUSER DRILL AIR PNEUMATIC (MISCELLANEOUS) ×3 IMPLANT
DRAPE C-ARM 42X72 X-RAY (DRAPES) ×6 IMPLANT
DRAPE LAPAROTOMY 100X72 PEDS (DRAPES) ×3 IMPLANT
DRAPE MICROSCOPE LEICA (MISCELLANEOUS) ×3 IMPLANT
DRILL NEURO 2X3.1 SOFT TOUCH (MISCELLANEOUS) ×3
DRSG OPSITE POSTOP 4X6 (GAUZE/BANDAGES/DRESSINGS) ×3 IMPLANT
DURAPREP 6ML APPLICATOR 50/CS (WOUND CARE) ×3 IMPLANT
ELECT COATED BLADE 2.86 ST (ELECTRODE) ×3 IMPLANT
ELECT REM PT RETURN 9FT ADLT (ELECTROSURGICAL) ×3
ELECTRODE REM PT RTRN 9FT ADLT (ELECTROSURGICAL) ×1 IMPLANT
GAUZE 4X4 16PLY RFD (DISPOSABLE) IMPLANT
GAUZE SPONGE 4X4 12PLY STRL (GAUZE/BANDAGES/DRESSINGS) IMPLANT
GLOVE BIO SURGEON STRL SZ7 (GLOVE) ×3 IMPLANT
GLOVE BIO SURGEON STRL SZ8 (GLOVE) ×3 IMPLANT
GLOVE BIOGEL PI IND STRL 6.5 (GLOVE) ×1 IMPLANT
GLOVE BIOGEL PI IND STRL 7.0 (GLOVE) ×1 IMPLANT
GLOVE BIOGEL PI IND STRL 7.5 (GLOVE) ×1 IMPLANT
GLOVE BIOGEL PI IND STRL 8 (GLOVE) ×2 IMPLANT
GLOVE BIOGEL PI INDICATOR 6.5 (GLOVE) ×2
GLOVE BIOGEL PI INDICATOR 7.0 (GLOVE) ×2
GLOVE BIOGEL PI INDICATOR 7.5 (GLOVE) ×2
GLOVE BIOGEL PI INDICATOR 8 (GLOVE) ×4
GLOVE ECLIPSE 7.5 STRL STRAW (GLOVE) ×6 IMPLANT
GLOVE EXAM NITRILE XL STR (GLOVE) IMPLANT
GLOVE INDICATOR 8.5 STRL (GLOVE) ×3 IMPLANT
GLOVE SURG SS PI 6.0 STRL IVOR (GLOVE) ×6 IMPLANT
GOWN STRL REUS W/ TWL LRG LVL3 (GOWN DISPOSABLE) ×2 IMPLANT
GOWN STRL REUS W/ TWL XL LVL3 (GOWN DISPOSABLE) ×1 IMPLANT
GOWN STRL REUS W/TWL 2XL LVL3 (GOWN DISPOSABLE) ×3 IMPLANT
GOWN STRL REUS W/TWL LRG LVL3 (GOWN DISPOSABLE) ×6
GOWN STRL REUS W/TWL XL LVL3 (GOWN DISPOSABLE) ×3
HALTER HD/CHIN CERV TRACTION D (MISCELLANEOUS) ×3 IMPLANT
HEMOSTAT POWDER KIT SURGIFOAM (HEMOSTASIS) ×3 IMPLANT
KIT BASIN OR (CUSTOM PROCEDURE TRAY) ×3 IMPLANT
KIT TURNOVER KIT B (KITS) ×3 IMPLANT
NEEDLE SPNL 20GX3.5 QUINCKE YW (NEEDLE) ×3 IMPLANT
NS IRRIG 1000ML POUR BTL (IV SOLUTION) ×6 IMPLANT
OIL CARTRIDGE MAESTRO DRILL (MISCELLANEOUS) ×3
PACK LAMINECTOMY NEURO (CUSTOM PROCEDURE TRAY) ×3 IMPLANT
PAD ARMBOARD 7.5X6 YLW CONV (MISCELLANEOUS) ×9 IMPLANT
PIN DISTRACTION 14MM (PIN) ×6 IMPLANT
PLATE ANT CERV XTEND 2 LV (Plate) ×3 IMPLANT
SCREW VAR 4.2 XD SELF DRILL 14 (Screw) ×18 IMPLANT
SPACER HEDRON C 12X14X8 7D (Spacer) ×6 IMPLANT
SPONGE INTESTINAL PEANUT (DISPOSABLE) ×3 IMPLANT
SPONGE SURGIFOAM ABS GEL SZ50 (HEMOSTASIS) ×3 IMPLANT
STRIP CLOSURE SKIN 1/2X4 (GAUZE/BANDAGES/DRESSINGS) ×2 IMPLANT
SUT VIC AB 3-0 SH 8-18 (SUTURE) ×3 IMPLANT
SUT VICRYL 4-0 PS2 18IN ABS (SUTURE) ×3 IMPLANT
TAPE CLOTH 4X10 WHT NS (GAUZE/BANDAGES/DRESSINGS) ×3 IMPLANT
TOWEL GREEN STERILE (TOWEL DISPOSABLE) ×3 IMPLANT
TOWEL GREEN STERILE FF (TOWEL DISPOSABLE) ×3 IMPLANT
WATER STERILE IRR 1000ML POUR (IV SOLUTION) ×3 IMPLANT

## 2020-08-21 NOTE — Transfer of Care (Signed)
Immediate Anesthesia Transfer of Care Note  Patient: Xavier Willis  Procedure(s) Performed: Anterior Cervical Decompression Discectomy and Fusion, Cervical four-five, Cervical five-six (N/A Neck)  Patient Location: PACU  Anesthesia Type:General  Level of Consciousness: drowsy and patient cooperative  Airway & Oxygen Therapy: Patient Spontanous Breathing and Patient connected to face mask oxygen  Post-op Assessment: Report given to RN and Post -op Vital signs reviewed and stable  Post vital signs: Reviewed and stable  Last Vitals:  Vitals Value Taken Time  BP 124/73 08/21/20 1438  Temp    Pulse 82 08/21/20 1439  Resp 12 08/21/20 1439  SpO2 97 % 08/21/20 1439  Vitals shown include unvalidated device data.  Last Pain:  Vitals:   08/21/20 1020  TempSrc:   PainSc: 0-No pain      Patients Stated Pain Goal: 3 (21/82/88 3374)  Complications: No complications documented.

## 2020-08-21 NOTE — H&P (Signed)
Xavier Willis is an 69 y.o. male.   Chief Complaint: Neck pain bilateral shoulder and arm pain HPI: 69 year old gentleman with neck pain shoulder and arm pain numbness tingling his hands and exam consistent with myelopathy.  Work-up revealed severe cord compression and spondylosis at C4-5 and C5-6 above her previous C6-7 ACDF.  Due to patient progression of clinical syndrome imaging findings and failed conservative treatment I recommended anterior cervical discectomies and fusion at those 2 levels.  I extensively reviewed the risks and benefits of the operation with him as well as perioperative course expectations of outcome and alternatives of surgery and he understood and agreed to proceed forward.  Past Medical History:  Diagnosis Date  . Chronic back pain   . Diabetes mellitus without complication ([place])   . GERD (gastroesophageal reflux disease)   . Hyperlipidemia   . Hypertension   . Left hydrocele   . Lumbar stenosis   . Wears glasses     Past Surgical History:  Procedure Laterality Date  . BACK SURGERY    . CERVICAL FUSION  1994   C6 -- C7  . EYE SURGERY Bilateral    cataract removal; 06/03/15, 07/29/15  . HYDROCELE EXCISION Left 12/11/2013   Procedure: LEFT HYDROCELECTOMY ADULT;  Surgeon: Claybon Jabs, MD;  Location: [facility];  Service: Urology;  Laterality: Left;  . LUMBAR LAMINECTOMY/DECOMPRESSION MICRODISCECTOMY Bilateral 10/25/2019   Procedure: Microdiscectomy - left - Lumbar two-three - right - Lumbar four-Lumbar five redo;  Surgeon: Kary Kos, MD;  Location: [facility];  Service: Neurosurgery;  Laterality: Bilateral;  . WRIST GANGLION EXCISION Left 2004    History reviewed. No pertinent family history. Social History:  reports that he has quit smoking. He quit smokeless tobacco use about 11 years ago.  His smokeless tobacco use included snuff. He reports current alcohol use of about 14.0 standard drinks of alcohol per week. He reports that he does not use  drugs.  Allergies:  Allergies  Allergen Reactions  . Doxycycline Hives  . Vancomycin Hives  . Bactrim [Sulfamethoxazole-Trimethoprim] Rash    AND FLUSHING    Medications Prior to Admission  Medication Sig Dispense Refill  . acetaminophen (TYLENOL 8 HOUR ARTHRITIS PAIN) 650 MG CR tablet Take 1,300 mg by mouth every 8 (eight) hours as needed for pain.     . diphenhydrAMINE (BENADRYL) 25 MG tablet Take 50 mg by mouth at bedtime.    . enalapril (VASOTEC) 10 MG tablet Take 10 mg by mouth daily.    . fexofenadine (ALLEGRA) 180 MG tablet Take 180 mg by mouth daily.    Marland Kitchen lovastatin (MEVACOR) 40 MG tablet Take 40 mg by mouth daily.    . metFORMIN (GLUCOPHAGE-XR) 500 MG 24 hr tablet Take 500 mg by mouth 2 (two) times daily.     . tamsulosin (FLOMAX) 0.4 MG CAPS capsule Take 0.4 mg by mouth daily.     . traMADol (ULTRAM) 50 MG tablet Take 50 mg by mouth every 6 (six) hours as needed for moderate pain.    Marland Kitchen triamcinolone (NASACORT ALLERGY 24HR) 55 MCG/ACT AERO nasal inhaler Place 1 spray into the nose daily as needed (allergies).      Results for orders placed or performed during the hospital encounter of 08/21/20 (from the past 48 hour(s))  Glucose, capillary     Status: Abnormal   Collection Time: 08/21/20 10:09 AM  Result Value Ref Range   Glucose-Capillary 144 (H) 70 - 99 mg/dL    Comment: Glucose  reference range applies only to samples taken after fasting for at least 8 hours.   No results found.  Review of Systems  Musculoskeletal: Positive for neck pain.  Neurological: Positive for numbness.    Blood pressure 138/70, pulse 84, temperature 97.8 F (36.6 C), temperature source Temporal, resp. rate 18, height 6' (1.829 m), weight 102.1 kg, SpO2 97 %. Physical Exam HENT:     Head: Normocephalic.     Right Ear: Tympanic membrane normal.     Nose: Nose normal.     Mouth/Throat:     Mouth: Mucous membranes are moist.  Eyes:     Pupils: Pupils are equal, round, and reactive to  light.  Cardiovascular:     Rate and Rhythm: Normal rate.  Pulmonary:     Effort: Pulmonary effort is normal.  Abdominal:     General: Abdomen is flat.  Musculoskeletal:        General: Normal range of motion.     Cervical back: Normal range of motion.  Skin:    General: Skin is warm.  Neurological:     General: No focal deficit present.     Mental Status: He is alert.     Comments: Patient is awake and alert oriented strength 5-5 deltoid, bicep, tricep, wrist flexion, wrist extension, hand intrinsics      Assessment/Plan 69 year old presents for ACDF C4-5 C5-6.  Elaina Hoops, MD 08/21/2020, 11:17 AM

## 2020-08-21 NOTE — Op Note (Signed)
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 with severe stenosis cord compression at those levels as well as a herniated disc at C4-5  Postoperative diagnosis: Same  Procedure: Anterior cervical discectomies and fusion at C4-5 C5-6 utilizing the [place] and titanium cages packed with locally harvested autograft mixed with vivigen and anterior cervical plating utilizing the globus extend plating system  Surgeon: Dominica Severin Darrelle Barrell  Assistant: Nash Shearer  Anesthesia: General  EBL: Minimal  HPI: 69 year old gentleman with progressive worsening neck pain bilateral shoulder and arm pain numbness tingling weakness in his hands exam was consistent with myelopathy MRI scan showed severe cord compression and foraminal stenosis at C4-5 and C5-6 above a previous C6-7 fusion.  Due to patient progression of clinical syndrome imaging findings and failed conservative treatment I recommended anterior cervical discectomies and fusion.  I extensively went over the risks and benefits of the operation with him as well as perioperative course expectations of outcome and alternatives of surgery and he understood and agreed to proceed forward.  Operative procedure: Patient brought into the OR was due to general anesthesia positioned supine the neck in slight extension 5 pounds halter traction.  The right 7 neck was prepped and draped in routine sterile fashion preoperative x-ray localized the appropriate level so a curvilinear incision was made just off the midline to the anterior border of the sternocleidomastoid and the superficial layer of platysma was dissected out divided longitudinally.  The avascular plane between the sternocleidomastoid and the strap muscles was developed down to the prevertebral fascia.  Fascia dissected Kitners.  Intraoperative x-ray confirmed identification of E2-6 so annulotomy was made with a 15 blade scalpel to marked the disc base.  Longus goes and reflected laterally  exposing both that disc base and the one below it.  Self-retaining retractors were placed.  Anterior osteophytes were bitten off with a Leksell rongeur and a 2 and 3 Miller Kerrison punch.  Both disc base were drilled down the posterior annulus and osteophytic complexes capturing the bone shavings and mucus trap.  Under microscope illumination first working at C5-6 further drilling down and aggressive undermining of both endplates allowed identification of posterior longitudinal ligament which was removed in piecemeal fashion exposing the thecal sac.  Marching laterally was marked uncinate hypertrophy causing compression of both C6 nerve roots this was all removed both C6 nerve roots were decompressed and skeletonized flush with the pedicle.  Aggressive undermining of both endplates decompressing central canal.  This was then packed with Gelfoam and attention taken at C4-5.  Pathology here was a large free fragment disc herniation to the right this was all removed again aggressive undermining of both endplates and radical foraminotomies were performed decompressing the C5 nerve roots flush with the pedicle.  I then sized up to 8 mm cages packed with locally harvested autograft mix along with the vision then these were packed and inserted I then selected a 34 mm globus extend plate all screws had excellent purchase locking mechanisms were engaged.  Wounds and copiously irrigated to Kassim states was maintained some agree additional bone graft was packed underneath the plate in and around the cages then platysma was reapproximated with interrupted Vicryl skin was closed running 4 subcuticular Dermabond benzoin Steri-Strips and a sterile dressing was applied patient recovery in stable condition.  At the end the case all needle counts and sponge counts were correct.

## 2020-08-21 NOTE — Anesthesia Procedure Notes (Signed)
Procedure Name: Intubation Date/Time: 08/21/2020 12:05 PM Performed by: Kathryne Hitch, CRNA Pre-anesthesia Checklist: Patient identified, Emergency Drugs available, Suction available, Patient being monitored and Timeout performed Patient Re-evaluated:Patient Re-evaluated prior to induction Oxygen Delivery Method: Circle system utilized Preoxygenation: Pre-oxygenation with 100% oxygen Induction Type: IV induction, Rapid sequence and Cricoid Pressure applied Laryngoscope Size: Mac and 4 Grade View: Grade II Tube type: Oral Tube size: 7.5 mm Number of attempts: 1 Airway Equipment and Method: Rigid stylet Placement Confirmation: ETT inserted through vocal cords under direct vision,  positive ETCO2 and breath sounds checked- equal and bilateral Secured at: 23 cm Tube secured with: Tape Dental Injury: Teeth and Oropharynx as per pre-operative assessment

## 2020-08-22 ENCOUNTER — Encounter (HOSPITAL_COMMUNITY): Payer: Self-pay | Admitting: Neurosurgery

## 2020-08-22 DIAGNOSIS — M50022 Cervical disc disorder at C5-C6 level with myelopathy: Secondary | ICD-10-CM | POA: Diagnosis not present

## 2020-08-22 LAB — GLUCOSE, CAPILLARY: Glucose-Capillary: 193 mg/dL — ABNORMAL HIGH (ref 70–99)

## 2020-08-22 MED ORDER — HYDROCODONE-ACETAMINOPHEN 5-325 MG PO TABS
1.0000 | ORAL_TABLET | ORAL | 0 refills | Status: AC | PRN
Start: 2020-08-22 — End: ?

## 2020-08-22 NOTE — Anesthesia Postprocedure Evaluation (Signed)
Anesthesia Post Note  Patient: Xavier Willis  Procedure(s) Performed: Anterior Cervical Decompression Discectomy and Fusion, Cervical four-five, Cervical five-six (N/A Neck)     Patient location during evaluation: PACU Anesthesia Type: General Level of consciousness: awake and alert Pain management: pain level controlled Vital Signs Assessment: post-procedure vital signs reviewed and stable Respiratory status: spontaneous breathing, nonlabored ventilation and respiratory function stable Cardiovascular status: blood pressure returned to baseline and stable Postop Assessment: no apparent nausea or vomiting Anesthetic complications: no   No complications documented.  Last Vitals:  Vitals:   08/22/20 0407 08/22/20 0739  BP: 135/79 132/73  Pulse: 77 92  Resp: 20 16  Temp: 36.8 C 36.9 C  SpO2: 97% 97%    Last Pain:  Vitals:   08/22/20 1055  TempSrc:   PainSc: 3    Pain Goal: Patients Stated Pain Goal: 3 (08/22/20 0421)                 Lynda Rainwater

## 2020-08-22 NOTE — Discharge Instructions (Signed)
Wound Care  Keep the incision clean and dry remove the outer dressing in 2 days, leave the Steri-Strips intact.  Do not put any creams, lotions, or ointments on incision. Leave steri-strips on neck.  They will fall off by themselves.  Activity Walk each and every day, increasing distance each day. No lifting greater than 5 lbs.  Avoid excessive neck motion. No lifting no bending no twisting no driving or riding a car unless coming back and forth to see me. Wear neck brace at all times except when showering.   Diet Resume your normal diet.   Return to Work Will be discussed at you follow up appointment.  Call Your Doctor If Any of These Occur Redness, drainage, or swelling at the wound.  Temperature greater than 101 degrees. Severe pain not relieved by pain medication. Incision starts to come apart.  Follow Up Appt Call today for appointment in 1-2 weeks ([phone]) or for problems.  If you have any hardware placed in your spine, you will need an x-ray before your appointment.   

## 2020-08-22 NOTE — Discharge Summary (Signed)
  Physician Discharge Summary  Patient ID: Xavier Willis MRN: [id number] DOB/AGE: Jan 11, 1951 69 y.o. Estimated body mass index is 30.52 kg/m as calculated from the following:   Height as of this encounter: 6' (1.829 m).   Weight as of this encounter: 102.1 kg.   Admit date: 08/21/2020 Discharge date: 08/22/2020  Admission Diagnoses: Changed cervical spondylitic myeloradiculopathy  Discharge Diagnoses: Same Active Problems:   HNP (herniated nucleus pulposus) with myelopathy, cervical   Discharged Condition: good  Hospital Course: Patient was admitted to hospital underwent ACDF C4-5 C5-6 postoperative patient did very well recovering the floor on the floor was ambulating and voiding spontaneously tolerating regular diet stable for discharge home.  Consults: Significant Diagnostic Studies: Treatments: ACDF C4-5 C5-6 Discharge Exam: Blood pressure 132/73, pulse 92, temperature 98.5 F (36.9 C), temperature source Oral, resp. rate 16, height 6' (1.829 m), weight 102.1 kg, SpO2 97 %. Strength 5 and 5 wound clean dry and intact  Disposition: Home   Allergies as of 08/22/2020      Reactions   Doxycycline Hives   Vancomycin Hives   Bactrim [sulfamethoxazole-trimethoprim] Rash   AND FLUSHING      Medication List    TAKE these medications   diphenhydrAMINE 25 MG tablet Commonly known as: BENADRYL Take 50 mg by mouth at bedtime.   enalapril 10 MG tablet Commonly known as: VASOTEC Take 10 mg by mouth daily.   fexofenadine 180 MG tablet Commonly known as: ALLEGRA Take 180 mg by mouth daily.   HYDROcodone-acetaminophen 5-325 MG tablet Commonly known as: NORCO/VICODIN Take 1 tablet by mouth every 4 (four) hours as needed for moderate pain ((score 4 to 6) not relieved by tramadol).   lovastatin 40 MG tablet Commonly known as: MEVACOR Take 40 mg by mouth daily.   metFORMIN 500 MG 24 hr tablet Commonly known as: GLUCOPHAGE-XR Take 500 mg by mouth 2 (two) times daily.    Nasacort Allergy 24HR 55 MCG/ACT Aero nasal inhaler Generic drug: triamcinolone Place 1 spray into the nose daily as needed (allergies).   tamsulosin 0.4 MG Caps capsule Commonly known as: FLOMAX Take 0.4 mg by mouth daily.   traMADol 50 MG tablet Commonly known as: ULTRAM Take 50 mg by mouth every 6 (six) hours as needed for moderate pain.   Tylenol 8 Hour Arthritis Pain 650 MG CR tablet Generic drug: acetaminophen Take 1,300 mg by mouth every 8 (eight) hours as needed for pain.        Signed: Elaina Hoops 08/22/2020, 8:06 AM

## 2020-08-22 NOTE — Plan of Care (Signed)
Patient alert and oriented, mae's well, voiding adequate amount of urine, swallowing without difficulty, no c/o pain at time of discharge. Patient discharged home with family. Script and discharged instructions given to patient. Patient and family stated understanding of instructions given. Patient has an appointment with Dr. Cram 

## 2020-09-24 DIAGNOSIS — M5412 Radiculopathy, cervical region: Secondary | ICD-10-CM | POA: Diagnosis not present

## 2020-09-25 DIAGNOSIS — M25542 Pain in joints of left hand: Secondary | ICD-10-CM | POA: Diagnosis not present

## 2020-09-25 DIAGNOSIS — R29898 Other symptoms and signs involving the musculoskeletal system: Secondary | ICD-10-CM | POA: Diagnosis not present

## 2020-09-25 DIAGNOSIS — M25541 Pain in joints of right hand: Secondary | ICD-10-CM | POA: Diagnosis not present

## 2020-10-17 DIAGNOSIS — G5603 Carpal tunnel syndrome, bilateral upper limbs: Secondary | ICD-10-CM | POA: Diagnosis not present

## 2020-10-17 DIAGNOSIS — G5623 Lesion of ulnar nerve, bilateral upper limbs: Secondary | ICD-10-CM | POA: Diagnosis not present

## 2020-10-24 DIAGNOSIS — Z6831 Body mass index (BMI) 31.0-31.9, adult: Secondary | ICD-10-CM | POA: Diagnosis not present

## 2020-10-24 DIAGNOSIS — I10 Essential (primary) hypertension: Secondary | ICD-10-CM | POA: Diagnosis not present

## 2020-10-24 DIAGNOSIS — G5603 Carpal tunnel syndrome, bilateral upper limbs: Secondary | ICD-10-CM | POA: Diagnosis not present

## 2020-11-11 DIAGNOSIS — G5601 Carpal tunnel syndrome, right upper limb: Secondary | ICD-10-CM | POA: Diagnosis not present

## 2020-12-02 DIAGNOSIS — Z23 Encounter for immunization: Secondary | ICD-10-CM | POA: Diagnosis not present

## 2021-01-24 IMAGING — CR DG LUMBAR SPINE 2-3V
2 series · 2 of 2 positions shown · non-contrast
Comparison: MRI 10/09/2019

CLINICAL DATA: Lumbar spine surgery.

EXAM:
LUMBAR SPINE - 2-3 VIEW

[lateral (1 of 2)]
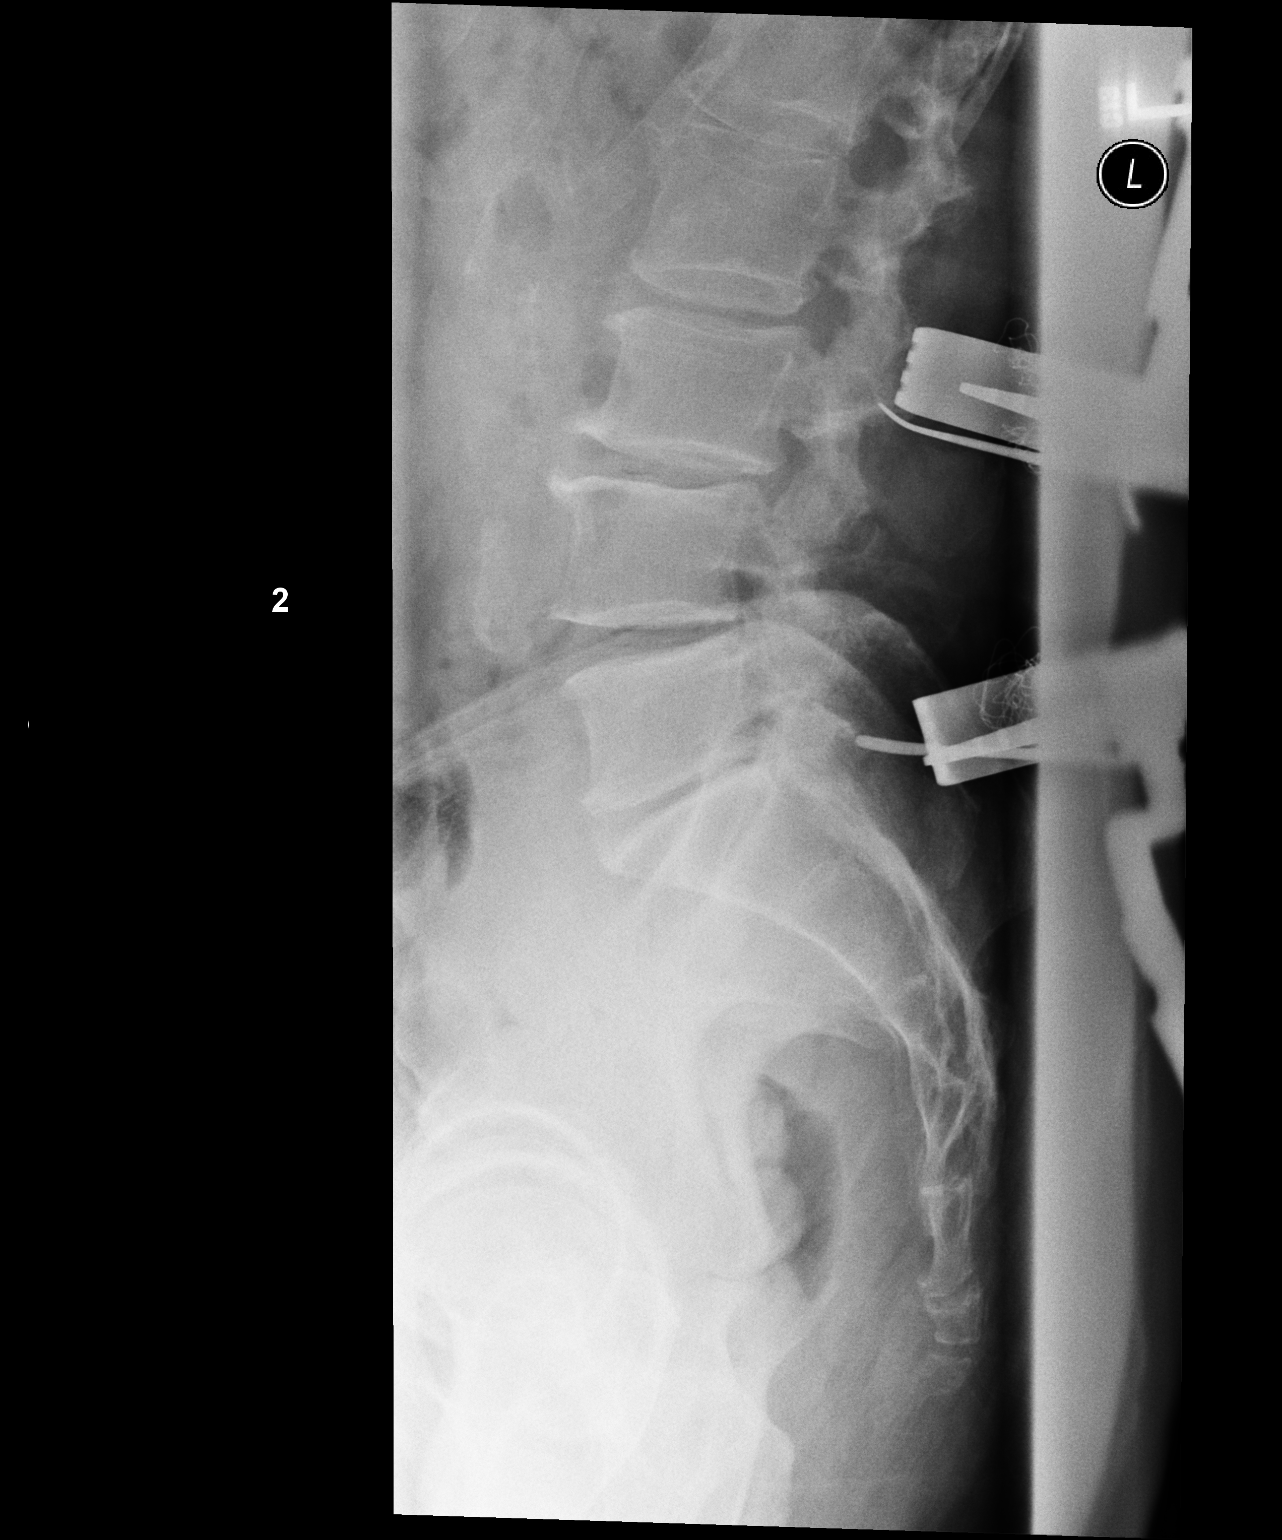

[lateral (2 of 2)]
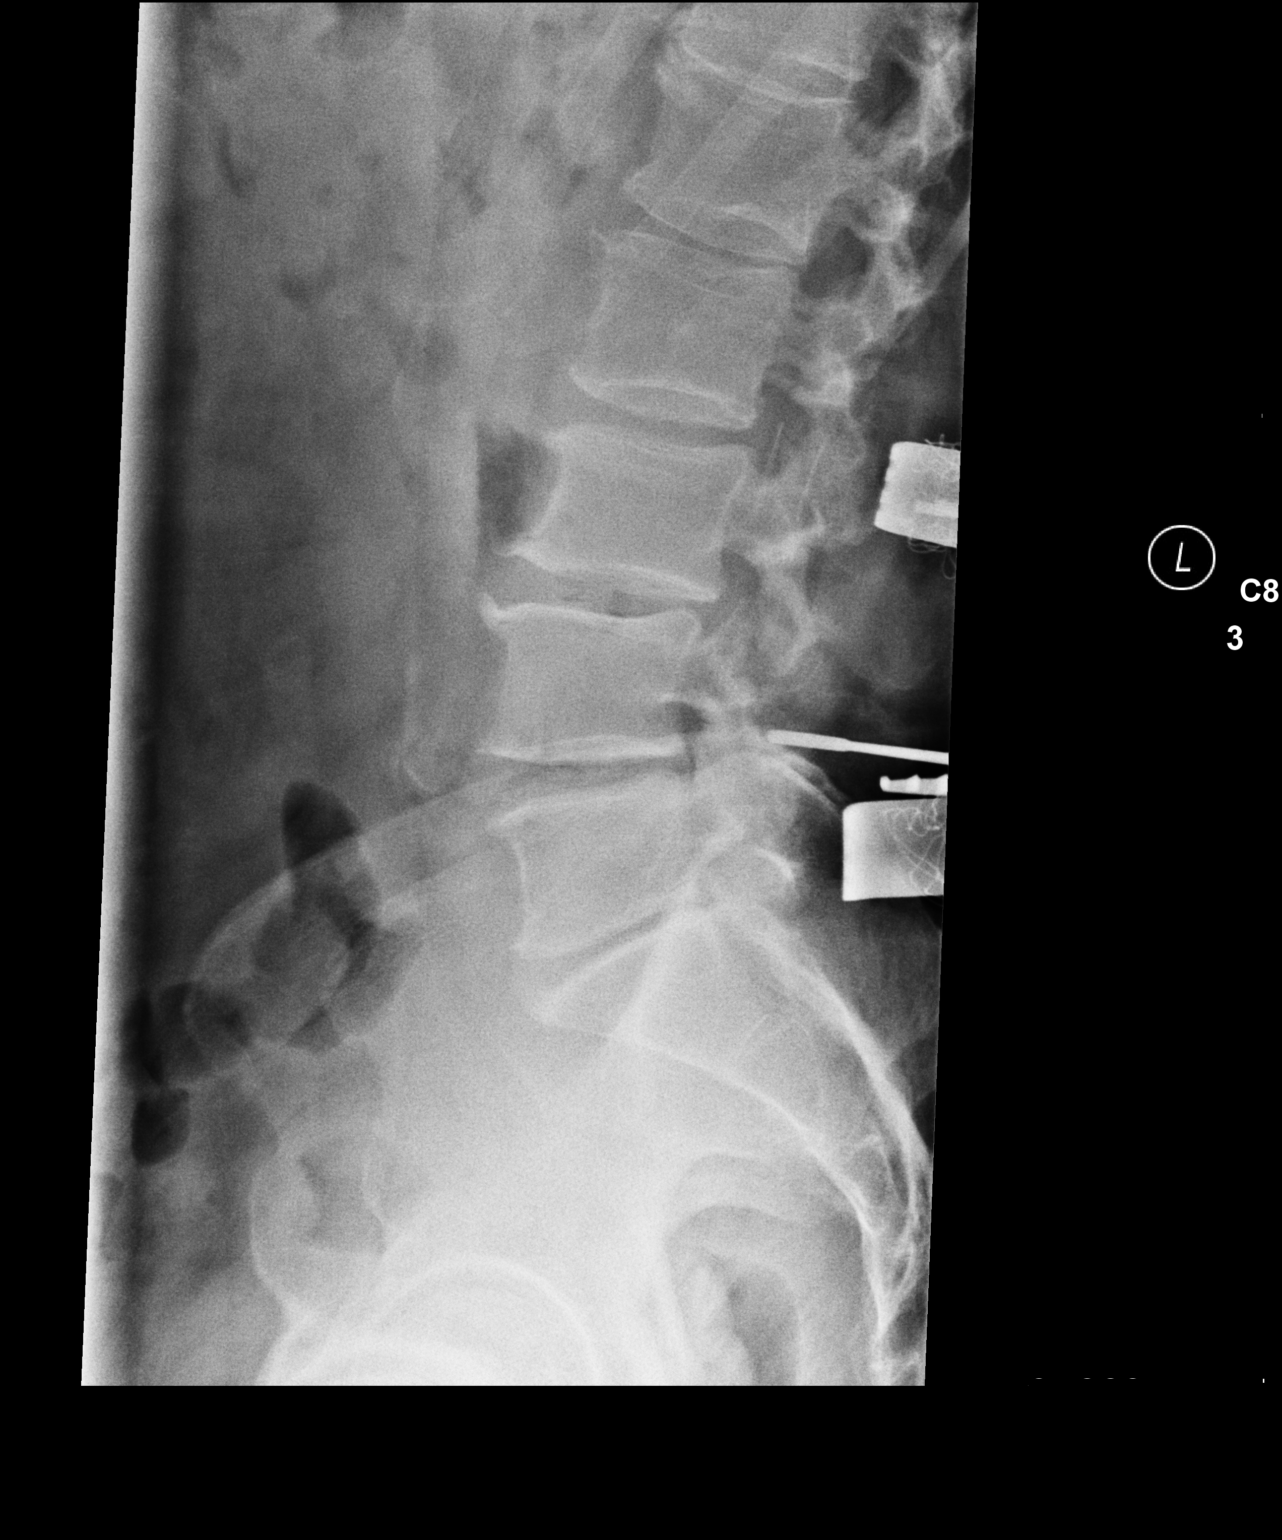

[2 of 2 positions shown; findings below may reference images not displayed]

FINDINGS: Using the same numbering scheme as on previous MRI the lumbar spine
levels have been annotated. Degenerative disc disease is noted
within the lumbar spine. Most advanced at L4-5 and L5-S1. A surgical
probe is identified posterior to the L5 vertebra.
IMPRESSION: 1. Intraoperative surgical probe localization of the L5 vertebra.

## 2021-02-03 DIAGNOSIS — E7800 Pure hypercholesterolemia, unspecified: Secondary | ICD-10-CM | POA: Diagnosis not present

## 2021-02-03 DIAGNOSIS — I10 Essential (primary) hypertension: Secondary | ICD-10-CM | POA: Diagnosis not present

## 2021-02-03 DIAGNOSIS — Z79899 Other long term (current) drug therapy: Secondary | ICD-10-CM | POA: Diagnosis not present

## 2021-02-03 DIAGNOSIS — N400 Enlarged prostate without lower urinary tract symptoms: Secondary | ICD-10-CM | POA: Diagnosis not present

## 2021-02-03 DIAGNOSIS — E1165 Type 2 diabetes mellitus with hyperglycemia: Secondary | ICD-10-CM | POA: Diagnosis not present

## 2021-02-03 DIAGNOSIS — Z0000 Encounter for general adult medical examination without abnormal findings: Secondary | ICD-10-CM | POA: Diagnosis not present

## 2021-02-03 DIAGNOSIS — Z6830 Body mass index (BMI) 30.0-30.9, adult: Secondary | ICD-10-CM | POA: Diagnosis not present

## 2021-02-03 DIAGNOSIS — Z1331 Encounter for screening for depression: Secondary | ICD-10-CM | POA: Diagnosis not present

## 2021-02-04 DIAGNOSIS — Z6830 Body mass index (BMI) 30.0-30.9, adult: Secondary | ICD-10-CM | POA: Diagnosis not present

## 2021-02-04 DIAGNOSIS — H6120 Impacted cerumen, unspecified ear: Secondary | ICD-10-CM | POA: Diagnosis not present

## 2021-02-04 DIAGNOSIS — I10 Essential (primary) hypertension: Secondary | ICD-10-CM | POA: Diagnosis not present

## 2021-02-10 DIAGNOSIS — Z1211 Encounter for screening for malignant neoplasm of colon: Secondary | ICD-10-CM | POA: Diagnosis not present

## 2021-02-10 DIAGNOSIS — Z1212 Encounter for screening for malignant neoplasm of rectum: Secondary | ICD-10-CM | POA: Diagnosis not present

## 2021-02-17 LAB — COLOGUARD: COLOGUARD: NEGATIVE

## 2021-05-01 DIAGNOSIS — D1801 Hemangioma of skin and subcutaneous tissue: Secondary | ICD-10-CM | POA: Diagnosis not present

## 2021-05-01 DIAGNOSIS — D2239 Melanocytic nevi of other parts of face: Secondary | ICD-10-CM | POA: Diagnosis not present

## 2021-05-01 DIAGNOSIS — L814 Other melanin hyperpigmentation: Secondary | ICD-10-CM | POA: Diagnosis not present

## 2021-05-01 DIAGNOSIS — D225 Melanocytic nevi of trunk: Secondary | ICD-10-CM | POA: Diagnosis not present

## 2021-05-07 DIAGNOSIS — R6889 Other general symptoms and signs: Secondary | ICD-10-CM | POA: Diagnosis not present

## 2021-05-07 DIAGNOSIS — J019 Acute sinusitis, unspecified: Secondary | ICD-10-CM | POA: Diagnosis not present

## 2021-05-07 DIAGNOSIS — Z1152 Encounter for screening for COVID-19: Secondary | ICD-10-CM | POA: Diagnosis not present

## 2021-05-07 DIAGNOSIS — U071 COVID-19: Secondary | ICD-10-CM | POA: Diagnosis not present

## 2021-06-04 DIAGNOSIS — R1310 Dysphagia, unspecified: Secondary | ICD-10-CM | POA: Diagnosis not present

## 2021-06-04 DIAGNOSIS — R599 Enlarged lymph nodes, unspecified: Secondary | ICD-10-CM | POA: Diagnosis not present

## 2021-06-30 DIAGNOSIS — J329 Chronic sinusitis, unspecified: Secondary | ICD-10-CM | POA: Diagnosis not present

## 2021-07-07 DIAGNOSIS — H61303 Acquired stenosis of external ear canal, unspecified, bilateral: Secondary | ICD-10-CM | POA: Diagnosis not present

## 2021-07-07 DIAGNOSIS — R0600 Dyspnea, unspecified: Secondary | ICD-10-CM | POA: Diagnosis not present

## 2021-07-07 DIAGNOSIS — J069 Acute upper respiratory infection, unspecified: Secondary | ICD-10-CM | POA: Diagnosis not present

## 2021-07-07 DIAGNOSIS — Z8709 Personal history of other diseases of the respiratory system: Secondary | ICD-10-CM | POA: Diagnosis not present

## 2021-07-07 DIAGNOSIS — J342 Deviated nasal septum: Secondary | ICD-10-CM | POA: Diagnosis not present

## 2021-07-07 DIAGNOSIS — R059 Cough, unspecified: Secondary | ICD-10-CM | POA: Diagnosis not present

## 2021-07-07 DIAGNOSIS — H6123 Impacted cerumen, bilateral: Secondary | ICD-10-CM | POA: Diagnosis not present

## 2021-07-07 DIAGNOSIS — Z8616 Personal history of COVID-19: Secondary | ICD-10-CM | POA: Diagnosis not present

## 2021-11-03 DIAGNOSIS — L02214 Cutaneous abscess of groin: Secondary | ICD-10-CM | POA: Diagnosis not present

## 2021-11-03 DIAGNOSIS — Z6831 Body mass index (BMI) 31.0-31.9, adult: Secondary | ICD-10-CM | POA: Diagnosis not present

## 2021-11-04 DIAGNOSIS — Z23 Encounter for immunization: Secondary | ICD-10-CM | POA: Diagnosis not present

## 2021-11-05 DIAGNOSIS — L720 Epidermal cyst: Secondary | ICD-10-CM | POA: Diagnosis not present

## 2021-11-05 DIAGNOSIS — L089 Local infection of the skin and subcutaneous tissue, unspecified: Secondary | ICD-10-CM | POA: Diagnosis not present

## 2021-11-19 DIAGNOSIS — L720 Epidermal cyst: Secondary | ICD-10-CM | POA: Diagnosis not present

## 2021-11-19 DIAGNOSIS — L089 Local infection of the skin and subcutaneous tissue, unspecified: Secondary | ICD-10-CM | POA: Diagnosis not present

## 2021-12-18 DIAGNOSIS — L700 Acne vulgaris: Secondary | ICD-10-CM | POA: Diagnosis not present

## 2021-12-18 DIAGNOSIS — L02212 Cutaneous abscess of back [any part, except buttock]: Secondary | ICD-10-CM | POA: Diagnosis not present

## 2021-12-18 DIAGNOSIS — L02214 Cutaneous abscess of groin: Secondary | ICD-10-CM | POA: Diagnosis not present

## 2022-01-15 DIAGNOSIS — N400 Enlarged prostate without lower urinary tract symptoms: Secondary | ICD-10-CM | POA: Diagnosis not present

## 2022-01-15 DIAGNOSIS — Z79899 Other long term (current) drug therapy: Secondary | ICD-10-CM | POA: Diagnosis not present

## 2022-01-15 DIAGNOSIS — E1165 Type 2 diabetes mellitus with hyperglycemia: Secondary | ICD-10-CM | POA: Diagnosis not present

## 2022-01-15 DIAGNOSIS — Z6830 Body mass index (BMI) 30.0-30.9, adult: Secondary | ICD-10-CM | POA: Diagnosis not present

## 2022-01-20 DIAGNOSIS — N401 Enlarged prostate with lower urinary tract symptoms: Secondary | ICD-10-CM | POA: Diagnosis not present

## 2022-01-20 DIAGNOSIS — Z125 Encounter for screening for malignant neoplasm of prostate: Secondary | ICD-10-CM | POA: Diagnosis not present

## 2022-01-20 DIAGNOSIS — R351 Nocturia: Secondary | ICD-10-CM | POA: Diagnosis not present

## 2022-03-31 DIAGNOSIS — L720 Epidermal cyst: Secondary | ICD-10-CM | POA: Diagnosis not present

## 2022-04-14 DIAGNOSIS — L720 Epidermal cyst: Secondary | ICD-10-CM | POA: Diagnosis not present

## 2022-04-14 DIAGNOSIS — L089 Local infection of the skin and subcutaneous tissue, unspecified: Secondary | ICD-10-CM | POA: Diagnosis not present

## 2022-04-27 DIAGNOSIS — L089 Local infection of the skin and subcutaneous tissue, unspecified: Secondary | ICD-10-CM | POA: Diagnosis not present

## 2022-04-27 DIAGNOSIS — L739 Follicular disorder, unspecified: Secondary | ICD-10-CM | POA: Diagnosis not present

## 2022-04-27 DIAGNOSIS — Z7984 Long term (current) use of oral hypoglycemic drugs: Secondary | ICD-10-CM | POA: Diagnosis not present

## 2022-04-27 DIAGNOSIS — L720 Epidermal cyst: Secondary | ICD-10-CM | POA: Diagnosis not present

## 2022-04-27 DIAGNOSIS — L732 Hidradenitis suppurativa: Secondary | ICD-10-CM | POA: Diagnosis not present

## 2022-04-27 DIAGNOSIS — E119 Type 2 diabetes mellitus without complications: Secondary | ICD-10-CM | POA: Diagnosis not present

## 2022-07-14 DIAGNOSIS — I10 Essential (primary) hypertension: Secondary | ICD-10-CM | POA: Diagnosis not present

## 2022-07-14 DIAGNOSIS — L98491 Non-pressure chronic ulcer of skin of other sites limited to breakdown of skin: Secondary | ICD-10-CM | POA: Diagnosis not present

## 2022-09-29 DIAGNOSIS — Z23 Encounter for immunization: Secondary | ICD-10-CM | POA: Diagnosis not present

## 2022-09-29 DIAGNOSIS — Z1331 Encounter for screening for depression: Secondary | ICD-10-CM | POA: Diagnosis not present

## 2022-09-29 DIAGNOSIS — Z0000 Encounter for general adult medical examination without abnormal findings: Secondary | ICD-10-CM | POA: Diagnosis not present

## 2022-09-29 DIAGNOSIS — Z6830 Body mass index (BMI) 30.0-30.9, adult: Secondary | ICD-10-CM | POA: Diagnosis not present

## 2022-09-29 DIAGNOSIS — L98491 Non-pressure chronic ulcer of skin of other sites limited to breakdown of skin: Secondary | ICD-10-CM | POA: Diagnosis not present

## 2022-09-29 DIAGNOSIS — E1165 Type 2 diabetes mellitus with hyperglycemia: Secondary | ICD-10-CM | POA: Diagnosis not present

## 2022-11-09 DIAGNOSIS — Z23 Encounter for immunization: Secondary | ICD-10-CM | POA: Diagnosis not present

## 2022-11-25 DIAGNOSIS — L300 Nummular dermatitis: Secondary | ICD-10-CM | POA: Diagnosis not present

## 2022-11-25 DIAGNOSIS — L570 Actinic keratosis: Secondary | ICD-10-CM | POA: Diagnosis not present

## 2022-11-25 DIAGNOSIS — L578 Other skin changes due to chronic exposure to nonionizing radiation: Secondary | ICD-10-CM | POA: Diagnosis not present

## 2022-11-25 DIAGNOSIS — L820 Inflamed seborrheic keratosis: Secondary | ICD-10-CM | POA: Diagnosis not present

## 2023-01-26 DIAGNOSIS — R351 Nocturia: Secondary | ICD-10-CM | POA: Diagnosis not present

## 2023-01-26 DIAGNOSIS — Z125 Encounter for screening for malignant neoplasm of prostate: Secondary | ICD-10-CM | POA: Diagnosis not present

## 2023-01-26 DIAGNOSIS — N401 Enlarged prostate with lower urinary tract symptoms: Secondary | ICD-10-CM | POA: Diagnosis not present

## 2023-04-23 DIAGNOSIS — H6122 Impacted cerumen, left ear: Secondary | ICD-10-CM | POA: Diagnosis not present

## 2023-06-07 DIAGNOSIS — M7989 Other specified soft tissue disorders: Secondary | ICD-10-CM | POA: Diagnosis not present

## 2023-06-07 DIAGNOSIS — Z6829 Body mass index (BMI) 29.0-29.9, adult: Secondary | ICD-10-CM | POA: Diagnosis not present

## 2023-09-05 DIAGNOSIS — R109 Unspecified abdominal pain: Secondary | ICD-10-CM | POA: Diagnosis not present

## 2023-09-05 DIAGNOSIS — R739 Hyperglycemia, unspecified: Secondary | ICD-10-CM | POA: Diagnosis not present

## 2023-09-05 DIAGNOSIS — I959 Hypotension, unspecified: Secondary | ICD-10-CM | POA: Diagnosis not present

## 2023-09-05 DIAGNOSIS — Z66 Do not resuscitate: Secondary | ICD-10-CM | POA: Diagnosis not present

## 2023-09-05 DIAGNOSIS — R748 Abnormal levels of other serum enzymes: Secondary | ICD-10-CM | POA: Diagnosis not present

## 2023-09-05 DIAGNOSIS — Z7984 Long term (current) use of oral hypoglycemic drugs: Secondary | ICD-10-CM | POA: Diagnosis not present

## 2023-09-05 DIAGNOSIS — E7800 Pure hypercholesterolemia, unspecified: Secondary | ICD-10-CM | POA: Diagnosis not present

## 2023-09-05 DIAGNOSIS — R140 Abdominal distension (gaseous): Secondary | ICD-10-CM | POA: Diagnosis not present

## 2023-09-05 DIAGNOSIS — K5730 Diverticulosis of large intestine without perforation or abscess without bleeding: Secondary | ICD-10-CM | POA: Diagnosis not present

## 2023-09-05 DIAGNOSIS — E878 Other disorders of electrolyte and fluid balance, not elsewhere classified: Secondary | ICD-10-CM | POA: Diagnosis not present

## 2023-09-05 DIAGNOSIS — N179 Acute kidney failure, unspecified: Secondary | ICD-10-CM | POA: Diagnosis not present

## 2023-09-05 DIAGNOSIS — E871 Hypo-osmolality and hyponatremia: Secondary | ICD-10-CM | POA: Diagnosis not present

## 2023-09-05 DIAGNOSIS — K760 Fatty (change of) liver, not elsewhere classified: Secondary | ICD-10-CM | POA: Diagnosis not present

## 2023-09-05 DIAGNOSIS — K838 Other specified diseases of biliary tract: Secondary | ICD-10-CM | POA: Diagnosis not present

## 2023-09-05 DIAGNOSIS — R112 Nausea with vomiting, unspecified: Secondary | ICD-10-CM | POA: Diagnosis not present

## 2023-09-05 DIAGNOSIS — Z882 Allergy status to sulfonamides status: Secondary | ICD-10-CM | POA: Diagnosis not present

## 2023-09-05 DIAGNOSIS — M1990 Unspecified osteoarthritis, unspecified site: Secondary | ICD-10-CM | POA: Diagnosis not present

## 2023-09-05 DIAGNOSIS — I10 Essential (primary) hypertension: Secondary | ICD-10-CM | POA: Diagnosis not present

## 2023-09-05 DIAGNOSIS — E8729 Other acidosis: Secondary | ICD-10-CM | POA: Diagnosis not present

## 2023-09-05 DIAGNOSIS — Z881 Allergy status to other antibiotic agents status: Secondary | ICD-10-CM | POA: Diagnosis not present

## 2023-09-05 DIAGNOSIS — N400 Enlarged prostate without lower urinary tract symptoms: Secondary | ICD-10-CM | POA: Diagnosis not present

## 2023-09-05 DIAGNOSIS — R1084 Generalized abdominal pain: Secondary | ICD-10-CM | POA: Diagnosis not present

## 2023-09-05 DIAGNOSIS — E1165 Type 2 diabetes mellitus with hyperglycemia: Secondary | ICD-10-CM | POA: Diagnosis not present

## 2023-09-05 DIAGNOSIS — E860 Dehydration: Secondary | ICD-10-CM | POA: Diagnosis not present

## 2023-09-05 DIAGNOSIS — R1031 Right lower quadrant pain: Secondary | ICD-10-CM | POA: Diagnosis not present

## 2023-09-05 DIAGNOSIS — R160 Hepatomegaly, not elsewhere classified: Secondary | ICD-10-CM | POA: Diagnosis not present

## 2023-09-05 DIAGNOSIS — Z79899 Other long term (current) drug therapy: Secondary | ICD-10-CM | POA: Diagnosis not present

## 2023-09-09 DIAGNOSIS — Z6828 Body mass index (BMI) 28.0-28.9, adult: Secondary | ICD-10-CM | POA: Diagnosis not present

## 2023-09-09 DIAGNOSIS — R109 Unspecified abdominal pain: Secondary | ICD-10-CM | POA: Diagnosis not present

## 2023-09-15 DIAGNOSIS — R748 Abnormal levels of other serum enzymes: Secondary | ICD-10-CM | POA: Diagnosis not present

## 2023-09-15 DIAGNOSIS — Z6827 Body mass index (BMI) 27.0-27.9, adult: Secondary | ICD-10-CM | POA: Diagnosis not present

## 2023-09-15 DIAGNOSIS — R0981 Nasal congestion: Secondary | ICD-10-CM | POA: Diagnosis not present

## 2023-10-04 ENCOUNTER — Encounter: Payer: Self-pay | Admitting: Physician Assistant

## 2023-10-04 ENCOUNTER — Other Ambulatory Visit (INDEPENDENT_AMBULATORY_CARE_PROVIDER_SITE_OTHER): Payer: PPO

## 2023-10-04 ENCOUNTER — Ambulatory Visit: Payer: PPO | Admitting: Physician Assistant

## 2023-10-04 VITALS — BP 128/70 | HR 90 | Ht 72.0 in | Wt 221.0 lb

## 2023-10-04 DIAGNOSIS — K760 Fatty (change of) liver, not elsewhere classified: Secondary | ICD-10-CM

## 2023-10-04 DIAGNOSIS — R7989 Other specified abnormal findings of blood chemistry: Secondary | ICD-10-CM

## 2023-10-04 DIAGNOSIS — K219 Gastro-esophageal reflux disease without esophagitis: Secondary | ICD-10-CM | POA: Diagnosis not present

## 2023-10-04 LAB — CBC WITH DIFFERENTIAL/PLATELET
Basophils Absolute: 0.1 10*3/uL (ref 0.0–0.1)
Basophils Relative: 2 % (ref 0.0–3.0)
Eosinophils Absolute: 0.5 10*3/uL (ref 0.0–0.7)
Eosinophils Relative: 7.2 % — ABNORMAL HIGH (ref 0.0–5.0)
HCT: 41.4 % (ref 39.0–52.0)
Hemoglobin: 13.7 g/dL (ref 13.0–17.0)
Lymphocytes Relative: 27.7 % (ref 12.0–46.0)
Lymphs Abs: 1.7 10*3/uL (ref 0.7–4.0)
MCHC: 33.1 g/dL (ref 30.0–36.0)
MCV: 89.3 fL (ref 78.0–100.0)
Monocytes Absolute: 0.6 10*3/uL (ref 0.1–1.0)
Monocytes Relative: 8.8 % (ref 3.0–12.0)
Neutro Abs: 3.4 10*3/uL (ref 1.4–7.7)
Neutrophils Relative %: 54.3 % (ref 43.0–77.0)
Platelets: 184 10*3/uL (ref 150.0–400.0)
RBC: 4.64 Mil/uL (ref 4.22–5.81)
RDW: 14.6 % (ref 11.5–15.5)
WBC: 6.3 10*3/uL (ref 4.0–10.5)

## 2023-10-04 LAB — HEPATIC FUNCTION PANEL
ALT: 37 U/L (ref 0–53)
AST: 25 U/L (ref 0–37)
Albumin: 4.6 g/dL (ref 3.5–5.2)
Alkaline Phosphatase: 47 U/L (ref 39–117)
Bilirubin, Direct: 0.1 mg/dL (ref 0.0–0.3)
Total Bilirubin: 0.7 mg/dL (ref 0.2–1.2)
Total Protein: 7.7 g/dL (ref 6.0–8.3)

## 2023-10-04 LAB — BASIC METABOLIC PANEL
BUN: 12 mg/dL (ref 6–23)
CO2: 27 meq/L (ref 19–32)
Calcium: 9.7 mg/dL (ref 8.4–10.5)
Chloride: 100 meq/L (ref 96–112)
Creatinine, Ser: 1.06 mg/dL (ref 0.40–1.50)
GFR: 69.28 mL/min (ref >60.00)
Glucose, Bld: 169 mg/dL — ABNORMAL HIGH (ref 70–99)
Potassium: 4.4 meq/L (ref 3.5–5.1)
Sodium: 136 meq/L (ref 135–145)

## 2023-10-04 LAB — IBC + FERRITIN
Ferritin: 512.2 ng/mL — ABNORMAL HIGH (ref 22.0–322.0)
Iron: 111 ug/dL (ref 42–165)
Saturation Ratios: 24.5 % (ref 20.0–50.0)
TIBC: 452.2 ug/dL — ABNORMAL HIGH (ref 250.0–450.0)
Transferrin: 323 mg/dL (ref 212.0–360.0)

## 2023-10-04 LAB — GAMMA GT: GGT: 34 U/L (ref 7–51)

## 2023-10-04 NOTE — Progress Notes (Signed)
10/04/2023 Weldon Inches [id number] 1951/10/24  Referring provider: Marylen Ponto, MD Primary GI doctor: Dr. Chales Abrahams  ASSESSMENT AND PLAN:   Elevated Liver Function Tests (LFTs) Significant elevation in LFTs with a slightly elevated total bilirubin of 5.7. Hepatitis panel negative. Imaging studies (CT, ultrasound, MRCP) showed hepatic steatosis and mild gallbladder sludge, but no stones or biliary ductal dilation. No current symptoms of discomfort, pain, nausea, or vomiting. -Recheck liver function to ensure continued decrease. -Check autoimmune panel for other causes -Advise low fat diet.  Possible Gallstone Passage Given the history of more greasy and fatty foods prior to the episode and the presence of mild gallbladder sludge on MRCP, it is possible the patient passed a gallstone. -Follow up with general surgery. -Advise low fat diet.  Follow-up in 6 months. If symptoms worsen, patient to call in the meantime.         Patient Care Team: Marylen Ponto, MD as PCP - General (Family Medicine)  HISTORY OF PRESENT ILLNESS: 72 y.o. male with a past medical history of diabetes, GERD, hyperlipidemia, hypertension, lumbar stenosis and others listed below presents for evaluation of gallstones.   Patient previously known to Dr. Chales Abrahams See TE [facility] from [place] for possible transfer to [facility] for LFT elevation,  AST 228 ALT 229 bili 5.7 d bili 4.7 Alk phos normal.  Lipase was normal. He had leukocytosis with left shift, afebrile. Negative hepatitis panel.  CT was normal except for hepatic steatosis.  US showed no stones, no CBD dilation.   MRCP 09/06/2023 IMPRESSION: 1. No acute abnormality. Mild gallbladder sludge. No cholelithiasis. No MRI findings of acute cholecystitis. No biliary ductal dilatation. No choledocholithiasis. 2. Moderate diffuse hepatic steatosis. 3. Moderate colonic diverticulosis   He did see General surgeon in [place]  with [facility], states he saw nothing of concern and no need for cholecystectomy, sent here.   He had never had anything like that before.   [place] in [place] seafood during that trip with progression of his symptoms from then. That week he felt poorly all week with nausea, decreased appetite and stomach upset and that Sunday went to the hospital.  Patient had RLQ ab pain radiating to RUQ, nausea, vomiting, progressively worse for several days.  No new medications.  He has had some GERD, rare with certain foods. No further nausea, vomiting.  Has not had any further AB pain, one after the hospital he had some RUQ AB pain.  He had cervical neck fusion and had trouble swallowing after that, now just occ he will have dysphagia, can be will pills or with liquids.  He is on antiacid for this and states it seems to be well controlled.  Rechecked LFTs with PCP 6 days after discharge and LFTs had improved.  Last colonoscopy at age 47, did a cologuard 2 years ago that was negative with PCP.  Patient was adopted, unknown family history.   He denies blood thinner use.  He denies NSAID use.  He denies ETOH use.   He denies tobacco use.  He denies drug use.    He  reports that he has quit smoking. He quit smokeless tobacco use about 14 years ago.  His smokeless tobacco use included snuff. He reports that he does not currently use alcohol after a past usage of about 14.0 standard drinks of alcohol per week. He  reports that he does not use drugs.  RELEVANT LABS AND IMAGING:  CBC    Component Value Date/Time   WBC 11.4 (H) 08/20/2020 0918   RBC 4.48 08/20/2020 0918   HGB 13.7 08/20/2020 0918   HCT 41.5 08/20/2020 0918   PLT 241 08/20/2020 0918   MCV 92.6 08/20/2020 0918   MCH 30.6 08/20/2020 0918   MCHC 33.0 08/20/2020 0918   RDW 12.9 08/20/2020 0918   No results for input(s): "HGB" in the last 8760 hours.  CMP      Component Value Date/Time   NA 136 08/20/2020 0918   K 4.7 08/20/2020 0918   CL 100 08/20/2020 0918   CO2 25 08/20/2020 0918   GLUCOSE 159 (H) 08/20/2020 0918   BUN 14 08/20/2020 0918   CREATININE 1.05 08/20/2020 0918   CALCIUM 9.4 08/20/2020 0918   PROT 7.6 08/20/2020 0918   ALBUMIN 4.0 08/20/2020 0918   AST 15 08/20/2020 0918   ALT 25 08/20/2020 0918   ALKPHOS 57 08/20/2020 0918   BILITOT 1.0 08/20/2020 0918   GFRNONAA >60 08/20/2020 0918   GFRAA >60 08/20/2020 0918      Latest Ref Rng & Units 08/20/2020    9:18 AM  Hepatic Function  Total Protein 6.5 - 8.1 g/dL 7.6   Albumin 3.5 - 5.0 g/dL 4.0   AST 15 - 41 U/L 15   ALT 0 - 44 U/L 25   Alk Phosphatase 38 - 126 U/L 57   Total Bilirubin 0.3 - 1.2 mg/dL 1.0       Current Medications:   Current Outpatient Medications (Endocrine & Metabolic):    metFORMIN (GLUCOPHAGE-XR) 500 MG 24 hr tablet, Take 500 mg by mouth 2 (two) times daily.   Current Outpatient Medications (Cardiovascular):    enalapril (VASOTEC) 10 MG tablet, Take 10 mg by mouth daily.   lovastatin (MEVACOR) 40 MG tablet, Take 40 mg by mouth daily.  Current Outpatient Medications (Respiratory):    triamcinolone (NASACORT ALLERGY 24HR) 55 MCG/ACT AERO nasal inhaler, Place 1 spray into the nose daily as needed (allergies).   diphenhydrAMINE (BENADRYL) 25 MG tablet, Take 50 mg by mouth at bedtime. (Patient not taking: Reported on 10/04/2023)   fexofenadine (ALLEGRA) 180 MG tablet, Take 180 mg by mouth daily. (Patient not taking: Reported on 10/04/2023)  Current Outpatient Medications (Analgesics):    acetaminophen (TYLENOL 8 HOUR ARTHRITIS PAIN) 650 MG CR tablet, Take 1,300 mg by mouth every 8 (eight) hours as needed for pain.  (Patient not taking: Reported on 10/04/2023)   HYDROcodone-acetaminophen (NORCO/VICODIN) 5-325 MG tablet, Take 1 tablet by mouth every 4 (four) hours as needed for moderate pain ((score 4 to 6) not relieved by tramadol).   traMADol (ULTRAM)  50 MG tablet, Take 50 mg by mouth every 6 (six) hours as needed for moderate pain.   Current Outpatient Medications (Other):    famotidine (PEPCID) 20 MG tablet, Take 20 mg by mouth daily.   OVER THE COUNTER MEDICATION, Pt taking aller-flex 180 mg 1 a day   tamsulosin (FLOMAX) 0.4 MG CAPS capsule, Take 0.4 mg by mouth daily.   Medical History:  Past Medical History:  Diagnosis Date   Chronic back pain    Diabetes mellitus without complication (HCC)    GERD (gastroesophageal reflux disease)    Hyperlipidemia    Hypertension    Left hydrocele    Lumbar stenosis    Wears glasses    Allergies:  Allergies  Allergen Reactions   Doxycycline  Hives   Vancomycin Hives   Bactrim [Sulfamethoxazole-Trimethoprim] Rash    AND FLUSHING     Surgical History:  He  has a past surgical history that includes Cervical fusion (1994); Wrist ganglion excision (Left, 2004); Hydrocele surgery (Left, 12/11/2013); Lumbar laminectomy/decompression microdiscectomy (Bilateral, 10/25/2019); Eye surgery (Bilateral); Back surgery; and Anterior cervical decomp/discectomy fusion (N/A, 08/21/2020). Family History:  His family history is not on file. He was adopted.  REVIEW OF SYSTEMS  : All other systems reviewed and negative except where noted in the History of Present Illness.  PHYSICAL EXAM: BP 128/70   Pulse 90   Ht 6' (1.829 m)   Wt 221 lb (100.2 kg)   BMI 29.97 kg/m  General Appearance: Well nourished, in no apparent distress. Head:   Normocephalic and atraumatic. Eyes:  sclerae anicteric,conjunctive pink  Respiratory: Respiratory effort normal, BS equal bilaterally without rales, rhonchi, wheezing. Cardio: RRR with no MRGs. Peripheral pulses intact.  Abdomen: Soft,  Obese ,active bowel sounds. No tenderness . Without guarding and Without rebound. No masses. Rectal: Not evaluated Musculoskeletal: Full ROM, Normal gait. Without edema. Skin:  Dry and intact without significant lesions or  rashes Neuro: Alert and  oriented x4;  No focal deficits. Psych:  Cooperative. Normal mood and affect.    Doree Albee, PA-C 11:29 AM

## 2023-10-04 NOTE — Patient Instructions (Addendum)
We will give you a call to get scheduled for your 6 month follow up.   Your provider has requested that you go to the basement level for lab work before leaving today. Press "B" on the elevator. The lab is located at the first door on the left as you exit the elevator.  Cholelithiasis  Cholelithiasis is a disease in which gallstones form in the gallbladder. The gallbladder is an organ that stores bile. Bile is a fluid that helps to digest fats. Gallstones begin as small crystals and can slowly grow into stones. They may cause no symptoms until they block the gallbladder duct, or cystic duct, when the gallbladder tightens, or contracts, after food is eaten. This can cause pain and is known as a gallbladder attack, or biliary colic. There are two main types of gallstones: Cholesterol stones. These are the most common type of gallstone. These stones are made of hardened cholesterol and are usually yellow-green in color. Pigment stones. These are dark in color and are made of a red-yellow substance, called bilirubin,that forms when hemoglobin from red blood cells breaks down. What are the causes? This condition may be caused by too little or too much of the substances that are in bile. This can happen if the bile: Has too much bilirubin. This can happen in certain blood diseases, such as sickle cell anemia. Has too much cholesterol. Does not have enough bile salts. These salts help the body absorb and digest fats. It can also happen if the gallbladder is not emptying completely. This is common during pregnancy. What increases the risk? The following factors may make you more likely to develop this condition: Being older than 74 years of age. Eating a diet that is heavy in fried foods, fat, and refined carbohydrates, such as white bread and white rice. Being male. Having multiple pregnancies. Using medicines that contain male hormones (estrogen) for a long time. Having certain medical problems,  such as: Diabetes mellitus. Obesity. Cystic fibrosis. Crohn's disease. Cirrhosis or other long-term (chronic) liver disease. Certain blood diseases, such as sickle cell anemia or leukemia. Having a family history of gallstones. Losing weight quickly. What are the signs or symptoms? In many cases, having gallstones causes no symptoms. These are called silent gallstones. If a gallstone blocks your bile duct, it can cause a gallbladder attack. The main symptom of a gallbladder attack is sudden pain in the upper right part of the abdomen. The pain: Usually comes at night or after eating. Can last for one hour or more. Can spread to your right shoulder, back, or chest. Can feel like indigestion. This is discomfort, burning, or fullness in your upper abdomen. If the bile duct is blocked for more than a few hours, it can cause an infection or inflammation of your gallbladder (cholecystitis), liver, or pancreas. This can cause: Nausea or vomiting. Bloating. Pain in your abdomen that lasts for 5 hours or longer. Tenderness in your upper abdomen, often in the upper right section and under your rib cage. Fever or chills. Skin or the white parts of your eyes turning yellow (jaundice). This usually happens when a stone has blocked bile from passing through the bile duct. Dark pee (urine) or light-colored poop (stools). How is this diagnosed? This condition may be diagnosed based on: A physical exam. Your medical history. Ultrasound. CT scan. MRI. You may also have other tests, including: Blood tests to check for infection or inflammation. The HIDA scan to see the gallbladder and the bile ducts.  An endoscope to check for blockage in the bile ducts. How is this treated? Treatment depends on the severity of your symptoms. Silent gallstones do not need treatment. You may need treatment if a blockage causes a gallbladder attack or other symptoms. Treatment may include: If symptoms are mild, you may  care for yourself at home. For mild symptoms: Stop eating and drinking for 12-24 hours. You may drink water and clear liquids. This helps to "cool down" your gallbladder. After 1 or 2 days, eat a diet of simple or clear foods, such as broths and crackers. Take medicines for pain or nausea. Take antibiotics if you have an infection. If symptoms are severe, you may: Stay in the hospital for pain control or to treat severe infection. Have surgery to remove the gallbladder (cholecystectomy). This is the most common treatment if all other treatments have not worked. Take medicines to break up gallstones. Medicines may be used for up to 6-12 months. Have an procedure to capture and remove gallstones. Follow these instructions at home: Medicines Take over-the-counter and prescription medicines only as told by your health care provider. If you were prescribed antibiotics, take them as told by your provider. Do not stop using the antibiotic even if you start to feel better. Ask your provider if the medicine prescribed to you requires you to avoid driving or using machinery. Eating and drinking Drink enough fluid to keep your pee pale yellow. This is important during a gallbladder attack. Water and clear liquids are preferred. Follow a healthy diet. This includes: Reducing fatty foods, such as fried food and foods high in cholesterol. Reducing refined carbohydrates, such as white bread and white rice. Eating more fiber. Aim for foods such as almonds, fruit, and beans. General instructions Do not use any products that contain nicotine or tobacco. These products include cigarettes, chewing tobacco, and vaping devices, such as e-cigarettes. If you need help quitting, ask your provider. Maintain a healthy weight. Keep all follow-up visits. These may include seeing a specialist or a Careers adviser. Where to find more information [employer] of Diabetes and Digestive and Kidney Diseases:  [email or web address] Contact a health care provider if: You think you have had a gallbladder attack. You have been diagnosed with silent gallstones and you develop indigestion or pain in your abdomen. You have pain from a gallbladder attack that lasts for more than 2 hours. You begin to have attacks more often. You have nausea. You have dark pee or light-colored poop. Get help right away if: You have pain in your abdomen that lasts for more than 5 hours or is getting worse. You have a fever or chills. You have vomiting that does not go away. You develop jaundice. This information is not intended to replace advice given to you by your health care provider. Make sure you discuss any questions you have with your health care provider. Document Revised: 09/14/2022 Document Reviewed: 09/14/2022 Elsevier Patient Education  2024 [employer] Inc.   Metabolic dysfunction associated seatohepatitis  Now the leading cause of liver failure in the [place].  It is normally from such risk factors as obesity, diabetes, insulin resistance, high cholesterol, or metabolic syndrome.  The only definitive therapy is weight loss and exercise.   Suggest walking 20-30 mins daily.  Decreasing carbohydrates, increasing veggies.    Fatty Liver Fatty liver is the accumulation of fat in liver cells. It is also called hepatosteatosis or steatohepatitis. It is normal for your liver to contain some fat. If fat is more  than 5 to 10% of your liver's weight, you have fatty liver.  There are often no symptoms (problems) for years while damage is still occurring. People often learn about their fatty liver when they have medical tests for other reasons. Fat can damage your liver for years or even decades without causing problems. When it becomes severe, it can cause fatigue, weight loss, weakness, and confusion. This makes you more likely to develop more serious liver problems. The liver is the largest organ in the body. It does  a lot of work and often gives no warning signs when it is sick until late in a disease. The liver has many important jobs including: Breaking down foods. Storing vitamins, iron, and other minerals. Making proteins. Making bile for food digestion. Breaking down many products including medications, alcohol and some poisons.  PROGNOSIS  Fatty liver may cause no damage or it can lead to an inflammation of the liver. This is, called steatohepatitis.  Over time the liver may become scarred and hardened. This condition is called cirrhosis. Cirrhosis is serious and may lead to liver failure or cancer. NASH is one of the leading causes of cirrhosis. About 10-20% of Americans have fatty liver and a smaller 2-5% has NASH.  TREATMENT  Weight loss, fat restriction, and exercise in overweight patients produces inconsistent results but is worth trying. Good control of diabetes may reduce fatty liver. Eat a balanced, healthy diet. Increase your physical activity. There are no medical or surgical treatments for a fatty liver or NASH, but improving your diet and increasing your exercise may help prevent or reverse some of the damage. _______________________________________________________  If your blood pressure at your visit was 140/90 or greater, please contact your primary care physician to follow up on this.  _______________________________________________________  If you are age 74 or older, your body mass index should be between 23-30. Your Body mass index is 29.97 kg/m. If this is out of the aforementioned range listed, please consider follow up with your Primary Care Provider.  If you are age 61 or younger, your body mass index should be between 19-25. Your Body mass index is 29.97 kg/m. If this is out of the aformentioned range listed, please consider follow up with your Primary Care Provider.   ________________________________________________________  The [facility] providers would like to  encourage you to use [facility] to communicate with providers for non-urgent requests or questions.  Due to long hold times on the telephone, sending your provider a message by [facility] may be a faster and more efficient way to get a response.  Please allow 48 business hours for a response.  Please remember that this is for non-urgent requests.  _______________________________________________________ It was a pleasure to see you today!  Thank you for trusting me with your gastrointestinal care!

## 2023-10-07 NOTE — Progress Notes (Signed)
Agree with assessment/plan. Nl LFTs  Edman Circle, MD [facility] [phone]

## 2023-10-09 LAB — IGG: IgG (Immunoglobin G), Serum: 1122 mg/dL (ref 600–1540)

## 2023-10-09 LAB — MITOCHONDRIAL ANTIBODIES: Mitochondrial M2 Ab, IgG: 27.5 U — ABNORMAL HIGH (ref <=20.0)

## 2023-10-09 LAB — ANTI-SMOOTH MUSCLE ANTIBODY, IGG: Actin (Smooth Muscle) Antibody (IGG): <20 U (ref <20)

## 2023-10-09 LAB — ANA: Anti Nuclear Antibody (ANA): NEGATIVE

## 2023-11-08 DIAGNOSIS — N400 Enlarged prostate without lower urinary tract symptoms: Secondary | ICD-10-CM | POA: Diagnosis not present

## 2023-11-08 DIAGNOSIS — Z23 Encounter for immunization: Secondary | ICD-10-CM | POA: Diagnosis not present

## 2023-11-08 DIAGNOSIS — Z6829 Body mass index (BMI) 29.0-29.9, adult: Secondary | ICD-10-CM | POA: Diagnosis not present

## 2023-11-08 DIAGNOSIS — E7800 Pure hypercholesterolemia, unspecified: Secondary | ICD-10-CM | POA: Diagnosis not present

## 2023-11-08 DIAGNOSIS — E1165 Type 2 diabetes mellitus with hyperglycemia: Secondary | ICD-10-CM | POA: Diagnosis not present

## 2023-12-02 DIAGNOSIS — J329 Chronic sinusitis, unspecified: Secondary | ICD-10-CM | POA: Diagnosis not present

## 2023-12-29 ENCOUNTER — Other Ambulatory Visit (INDEPENDENT_AMBULATORY_CARE_PROVIDER_SITE_OTHER): Payer: PPO

## 2023-12-29 DIAGNOSIS — R7989 Other specified abnormal findings of blood chemistry: Secondary | ICD-10-CM | POA: Diagnosis not present

## 2023-12-29 LAB — HEPATIC FUNCTION PANEL
ALT: 35 U/L (ref 0–53)
AST: 21 U/L (ref 0–37)
Albumin: 4.9 g/dL (ref 3.5–5.2)
Alkaline Phosphatase: 51 U/L (ref 39–117)
Bilirubin, Direct: 0.1 mg/dL (ref 0.0–0.3)
Total Bilirubin: 0.5 mg/dL (ref 0.2–1.2)
Total Protein: 7.9 g/dL (ref 6.0–8.3)

## 2024-01-01 LAB — MITOCHONDRIAL ANTIBODIES: Mitochondrial M2 Ab, IgG: 30.9 U — ABNORMAL HIGH (ref <=20.0)

## 2024-01-03 NOTE — Telephone Encounter (Signed)
PT returned call  Please advise

## 2024-01-04 ENCOUNTER — Other Ambulatory Visit: Payer: PPO

## 2024-01-04 ENCOUNTER — Ambulatory Visit: Payer: PPO | Admitting: Physician Assistant

## 2024-01-04 ENCOUNTER — Encounter: Payer: Self-pay | Admitting: Physician Assistant

## 2024-01-04 VITALS — BP 144/80 | HR 80 | Wt 230.0 lb

## 2024-01-04 DIAGNOSIS — K760 Fatty (change of) liver, not elsewhere classified: Secondary | ICD-10-CM | POA: Diagnosis not present

## 2024-01-04 DIAGNOSIS — R7989 Other specified abnormal findings of blood chemistry: Secondary | ICD-10-CM | POA: Diagnosis not present

## 2024-01-04 DIAGNOSIS — R768 Other specified abnormal immunological findings in serum: Secondary | ICD-10-CM | POA: Diagnosis not present

## 2024-01-04 DIAGNOSIS — K829 Disease of gallbladder, unspecified: Secondary | ICD-10-CM | POA: Diagnosis not present

## 2024-01-04 DIAGNOSIS — K219 Gastro-esophageal reflux disease without esophagitis: Secondary | ICD-10-CM

## 2024-01-04 NOTE — Progress Notes (Signed)
01/04/2024 Xavier Willis [id number] Dec 26, 1950  Referring provider: Marylen Ponto, MD Primary GI doctor: Dr. Chales Abrahams  ASSESSMENT AND PLAN:   Elevated Liver Function Tests (LFTs) Significant elevation in LFTs with a slightly elevated total bilirubin of 5.7. Hepatitis panel negative. Imaging studies (CT, ultrasound, MRCP) showed hepatic steatosis and mild gallbladder sludge, but no stones or biliary ductal dilation. No current symptoms of discomfort, pain, nausea, or vomiting. -Negative autoimmune workup other than slightly positive AMA LFTs back to normal -Recheck AMA was positive again a little bit higher at 30, LFTs still normal patient without any alcohol use, no symptoms. -Discussed with the patient with previously normal IgG, ANA and normal liver function very low likelihood of autoimmune hepatitis or primary sclerosing cholangitis. -Will get antimicrosome we will liver and kidney antibody as a tie breaker -Recheck liver function in 6 months with follow-up with Dr. Chales Abrahams  Possible Gallstone Passage Given the history of more greasy and fatty foods prior to the episode and the presence of mild gallbladder sludge on MRCP, it is possible the patient passed a gallstone. -No symptoms discussed potentially repeating abdominal ultrasound declined at this time -Advise low fat diet.  Follow-up in 6 months. If symptoms worsen, patient to call in the meantime.         Patient Care Team: Marylen Ponto, MD as PCP - General (Family Medicine)  HISTORY OF PRESENT ILLNESS: 73 y.o. male with a past medical history of diabetes, GERD, hyperlipidemia, hypertension, lumbar stenosis and others listed below presents for evaluation of gallstones.   Patient previously known to Dr. Chales Abrahams See [facility] from [place] for possible transfer to [facility] for LFT elevation,  AST 228 ALT 229 bili 5.7 d bili 4.7 Alk phos normal.  Lipase was normal. He had leukocytosis with  left shift, afebrile. Negative hepatitis panel.  CT was normal except for hepatic steatosis.  US showed no stones, no CBD dilation.   MRCP 09/06/2023 IMPRESSION: 1. No acute abnormality. Mild gallbladder sludge. No cholelithiasis. No MRI findings of acute cholecystitis. No biliary ductal dilatation. No choledocholithiasis. 2. Moderate diffuse hepatic steatosis. 3. Moderate colonic diverticulosis   He did see General surgeon in [place] with [facility], states he saw nothing of concern and no need for cholecystectomy. 10/04/2023 patient seen in the office for history of elevated liver function with hepatic steatosis mild gallbladder sludge but no stones or biliary ductal dilation on MRCP.  Liver function resolved, hepatocellular workup was unremarkable with negative ANA and IgG however mitochondrial antibody was very slightly elevated at 27 12/29/2023 repeat labs showed normal liver function but continuing elevated mitochondrial antibody at 30.9.  He has not had any other pain since the hospital.  He has not had any ETOH, does not drink.  He has not had any yellowing of eyes/skin, swelling, AB pain.  Last colonoscopy at age 55, did a cologuard 2 years ago that was negative with PCP.  Patient was adopted, unknown family history.   He denies blood thinner use.  He denies NSAID use.  He denies ETOH use.   He denies tobacco use.  He denies drug use.    He  reports that he has quit smoking. He quit smokeless tobacco use about 15 years ago.  His smokeless tobacco use included snuff. He reports that he does not currently use alcohol after a past usage of about 14.0 standard drinks of alcohol per week. He reports that he does not  use drugs.  RELEVANT LABS AND IMAGING:  CBC    Component Value Date/Time   WBC 6.3 10/04/2023 1104   RBC 4.64 10/04/2023 1104   HGB 13.7 10/04/2023 1104   HCT 41.4 10/04/2023 1104   PLT 184.0 10/04/2023 1104   MCV 89.3 10/04/2023 1104   MCH 30.6 08/20/2020 0918    MCHC 33.1 10/04/2023 1104   RDW 14.6 10/04/2023 1104   LYMPHSABS 1.7 10/04/2023 1104   MONOABS 0.6 10/04/2023 1104   EOSABS 0.5 10/04/2023 1104   BASOSABS 0.1 10/04/2023 1104   Recent Labs    10/04/23 1104  HGB 13.7    CMP     Component Value Date/Time   NA 136 10/04/2023 1104   K 4.4 10/04/2023 1104   CL 100 10/04/2023 1104   CO2 27 10/04/2023 1104   GLUCOSE 169 (H) 10/04/2023 1104   BUN 12 10/04/2023 1104   CREATININE 1.06 10/04/2023 1104   CALCIUM 9.7 10/04/2023 1104   PROT 7.9 12/29/2023 1117   ALBUMIN 4.9 12/29/2023 1117   AST 21 12/29/2023 1117   ALT 35 12/29/2023 1117   ALKPHOS 51 12/29/2023 1117   BILITOT 0.5 12/29/2023 1117   GFRNONAA >60 08/20/2020 0918   GFRAA >60 08/20/2020 0918      Latest Ref Rng & Units 12/29/2023   11:17 AM 10/04/2023   11:04 AM 08/20/2020    9:18 AM  Hepatic Function  Total Protein 6.0 - 8.3 g/dL 7.9  7.7  7.6   Albumin 3.5 - 5.2 g/dL 4.9  4.6  4.0   AST 0 - 37 U/L 21  25  15    ALT 0 - 53 U/L 35  37  25   Alk Phosphatase 39 - 117 U/L 51  47  57   Total Bilirubin 0.2 - 1.2 mg/dL 0.5  0.7  1.0   Bilirubin, Direct 0.0 - 0.3 mg/dL 0.1  0.1        Current Medications:   Current Outpatient Medications (Endocrine & Metabolic):    metFORMIN (GLUCOPHAGE-XR) 500 MG 24 hr tablet, Take 500 mg by mouth 2 (two) times daily.   Current Outpatient Medications (Cardiovascular):    enalapril (VASOTEC) 10 MG tablet, Take 10 mg by mouth daily.   lovastatin (MEVACOR) 40 MG tablet, Take 40 mg by mouth daily.  Current Outpatient Medications (Respiratory):    diphenhydrAMINE (BENADRYL) 25 MG tablet, Take 50 mg by mouth at bedtime. (Patient not taking: Reported on 10/04/2023)   fexofenadine (ALLEGRA) 180 MG tablet, Take 180 mg by mouth daily. (Patient not taking: Reported on 10/04/2023)   triamcinolone (NASACORT ALLERGY 24HR) 55 MCG/ACT AERO nasal inhaler, Place 1 spray into the nose daily as needed (allergies).  Current Outpatient Medications  (Analgesics):    acetaminophen (TYLENOL 8 HOUR ARTHRITIS PAIN) 650 MG CR tablet, Take 1,300 mg by mouth every 8 (eight) hours as needed for pain.  (Patient not taking: Reported on 10/04/2023)   HYDROcodone-acetaminophen (NORCO/VICODIN) 5-325 MG tablet, Take 1 tablet by mouth every 4 (four) hours as needed for moderate pain ((score 4 to 6) not relieved by tramadol).   traMADol (ULTRAM) 50 MG tablet, Take 50 mg by mouth every 6 (six) hours as needed for moderate pain.   Current Outpatient Medications (Other):    famotidine (PEPCID) 20 MG tablet, Take 20 mg by mouth daily.   OVER THE COUNTER MEDICATION, Pt taking aller-flex 180 mg 1 a day   tamsulosin (FLOMAX) 0.4 MG CAPS capsule, Take 0.4 mg by mouth daily.  Medical History:  Past Medical History:  Diagnosis Date   Chronic back pain    Diabetes mellitus without complication (HCC)    GERD (gastroesophageal reflux disease)    Hyperlipidemia    Hypertension    Left hydrocele    Lumbar stenosis    Wears glasses    Allergies:  Allergies  Allergen Reactions   Doxycycline Hives   Vancomycin Hives   Bactrim [Sulfamethoxazole-Trimethoprim] Rash    AND FLUSHING     Surgical History:  He  has a past surgical history that includes Cervical fusion (1994); Wrist ganglion excision (Left, 2004); Hydrocele surgery (Left, 12/11/2013); Lumbar laminectomy/decompression microdiscectomy (Bilateral, 10/25/2019); Eye surgery (Bilateral); Back surgery; and Anterior cervical decomp/discectomy fusion (N/A, 08/21/2020). Family History:  His family history is not on file. He was adopted.  REVIEW OF SYSTEMS  : All other systems reviewed and negative except where noted in the History of Present Illness.  PHYSICAL EXAM: There were no vitals taken for this visit. General Appearance: Well nourished, in no apparent distress. Head:   Normocephalic and atraumatic. Eyes:  sclerae anicteric,conjunctive pink  Respiratory: Respiratory effort normal, BS equal  bilaterally without rales, rhonchi, wheezing. Cardio: RRR with no MRGs. Peripheral pulses intact.  Abdomen: Soft,  Obese ,active bowel sounds. No tenderness . Without guarding and Without rebound. No masses. Rectal: Not evaluated Musculoskeletal: Full ROM, Normal gait. Without edema. Skin:  Dry and intact without significant lesions or rashes Neuro: Alert and  oriented x4;  No focal deficits. Psych:  Cooperative. Normal mood and affect.    Doree Albee, PA-C 10:28 AM

## 2024-01-04 NOTE — Patient Instructions (Addendum)
Please call us in 4 months to get set up for your 6 month follow up.   Your provider has requested that you go to the basement level for lab work before leaving today. Press "B" on the elevator. The lab is located at the first door on the left as you exit the elevator.  Get labs today and then follow up labs in 6 months  Metabolic dysfunction associated seatohepatitis  Now the leading cause of liver failure in the [place].  It is normally from such risk factors as obesity, diabetes, insulin resistance, high cholesterol, or metabolic syndrome.  The only definitive therapy is weight loss and exercise.   Suggest walking 20-30 mins daily.  Decreasing carbohydrates, increasing veggies.    Fatty Liver Fatty liver is the accumulation of fat in liver cells. It is also called hepatosteatosis or steatohepatitis. It is normal for your liver to contain some fat. If fat is more than 5 to 10% of your liver's weight, you have fatty liver.  There are often no symptoms (problems) for years while damage is still occurring. People often learn about their fatty liver when they have medical tests for other reasons. Fat can damage your liver for years or even decades without causing problems. When it becomes severe, it can cause fatigue, weight loss, weakness, and confusion. This makes you more likely to develop more serious liver problems. The liver is the largest organ in the body. It does a lot of work and often gives no warning signs when it is sick until late in a disease. The liver has many important jobs including: Breaking down foods. Storing vitamins, iron, and other minerals. Making proteins. Making bile for food digestion. Breaking down many products including medications, alcohol and some poisons.  PROGNOSIS  Fatty liver may cause no damage or it can lead to an inflammation of the liver. This is, called steatohepatitis.  Over time the liver may become scarred and hardened. This condition is  called cirrhosis. Cirrhosis is serious and may lead to liver failure or cancer. NASH is one of the leading causes of cirrhosis. About 10-20% of Americans have fatty liver and a smaller 2-5% has NASH.  TREATMENT  Weight loss, fat restriction, and exercise in overweight patients produces inconsistent results but is worth trying. Good control of diabetes may reduce fatty liver. Eat a balanced, healthy diet. Increase your physical activity. There are no medical or surgical treatments for a fatty liver or NASH, but improving your diet and increasing your exercise may help prevent or reverse some of the damage.

## 2024-01-06 LAB — ANTI-MICROSOMAL ANTIBODY LIVER / KIDNEY: LKM1 Ab: <=20 U (ref <=20.0)

## 2024-01-13 NOTE — Progress Notes (Signed)
Agree with assessment/plan. Most recent LFTs were normal.  Edman Circle, MD [facility] [phone]

## 2024-08-16 DIAGNOSIS — E1165 Type 2 diabetes mellitus with hyperglycemia: Secondary | ICD-10-CM | POA: Diagnosis not present

## 2024-08-16 DIAGNOSIS — Z0000 Encounter for general adult medical examination without abnormal findings: Secondary | ICD-10-CM | POA: Diagnosis not present

## 2024-08-16 DIAGNOSIS — N400 Enlarged prostate without lower urinary tract symptoms: Secondary | ICD-10-CM | POA: Diagnosis not present

## 2024-08-16 DIAGNOSIS — E7800 Pure hypercholesterolemia, unspecified: Secondary | ICD-10-CM | POA: Diagnosis not present

## 2024-08-16 DIAGNOSIS — Z6829 Body mass index (BMI) 29.0-29.9, adult: Secondary | ICD-10-CM | POA: Diagnosis not present

## 2024-08-16 DIAGNOSIS — I10 Essential (primary) hypertension: Secondary | ICD-10-CM | POA: Diagnosis not present

## 2024-08-16 DIAGNOSIS — Z79899 Other long term (current) drug therapy: Secondary | ICD-10-CM | POA: Diagnosis not present

## 2024-08-16 DIAGNOSIS — Z1339 Encounter for screening examination for other mental health and behavioral disorders: Secondary | ICD-10-CM | POA: Diagnosis not present

## 2024-08-16 DIAGNOSIS — Z1331 Encounter for screening for depression: Secondary | ICD-10-CM | POA: Diagnosis not present

## 2024-08-24 DIAGNOSIS — Z1211 Encounter for screening for malignant neoplasm of colon: Secondary | ICD-10-CM | POA: Diagnosis not present

## 2024-08-24 DIAGNOSIS — Z1212 Encounter for screening for malignant neoplasm of rectum: Secondary | ICD-10-CM | POA: Diagnosis not present

## 2024-08-29 DIAGNOSIS — H6123 Impacted cerumen, bilateral: Secondary | ICD-10-CM | POA: Diagnosis not present

## 2024-08-29 DIAGNOSIS — J3489 Other specified disorders of nose and nasal sinuses: Secondary | ICD-10-CM | POA: Diagnosis not present

## 2024-08-29 LAB — COLOGUARD: COLOGUARD: NEGATIVE

## 2024-09-14 DIAGNOSIS — E1165 Type 2 diabetes mellitus with hyperglycemia: Secondary | ICD-10-CM | POA: Diagnosis not present

## 2024-09-14 DIAGNOSIS — Z6828 Body mass index (BMI) 28.0-28.9, adult: Secondary | ICD-10-CM | POA: Diagnosis not present

## 2024-09-14 DIAGNOSIS — H6123 Impacted cerumen, bilateral: Secondary | ICD-10-CM | POA: Diagnosis not present

## 2024-10-07 ENCOUNTER — Ambulatory Visit (INDEPENDENT_AMBULATORY_CARE_PROVIDER_SITE_OTHER): Admit: 2024-10-07 | Discharge: 2024-10-07 | Disposition: A | Discharge status: Home / Self Care | Admitting: Radiology

## 2024-10-07 ENCOUNTER — Ambulatory Visit (HOSPITAL_BASED_OUTPATIENT_CLINIC_OR_DEPARTMENT_OTHER): Admission: EM | Admit: 2024-10-07 | Discharge: 2024-10-07 | Disposition: A | Discharge status: Home / Self Care

## 2024-10-07 ENCOUNTER — Encounter (HOSPITAL_BASED_OUTPATIENT_CLINIC_OR_DEPARTMENT_OTHER): Payer: Self-pay

## 2024-10-07 DIAGNOSIS — M542 Cervicalgia: Secondary | ICD-10-CM

## 2024-10-07 DIAGNOSIS — M62838 Other muscle spasm: Secondary | ICD-10-CM | POA: Diagnosis not present

## 2024-10-07 DIAGNOSIS — Z981 Arthrodesis status: Secondary | ICD-10-CM | POA: Diagnosis not present

## 2024-10-07 MED ORDER — TIZANIDINE HCL 4 MG PO TABS: 4.0000 mg | ORAL_TABLET | Freq: Four times a day (QID) | ORAL | 0 refills | Status: AC | PRN

## 2024-10-07 NOTE — ED Triage Notes (Signed)
 Pt states that he has some neck pain. X3 days  Pt states that he has been taking Tylenol  extra strength for the pain.  Pt denies any known injury.

## 2024-10-07 NOTE — ED Provider Notes (Signed)
 [facility]    CSN: [id number] Arrival date & time: 10/07/24  9083      History   Chief Complaint Chief Complaint  Patient presents with   Neck Pain    HPI Xavier Willis is a 73 y.o. male.   Patient is a 73 year old male who presents today with left-sided neck pain.  This has been present waxing and waning for the last couple of days.  The pain is worse with rotation to the left.  The pain radiates into his scalp area.  History of C6-C7 fusion back in 2021.  Does have some issues from time to time with the left side of his neck.  He has been taking Exer strength Tylenol  which does help some.  Some tingling in the left hand.  No weakness in the arms.  The tingling resolved after removing around.  No headache, dizziness, fever.  Recently got back from vacation and did a lot of driving with turning of the head.   Neck Pain   Past Medical History:  Diagnosis Date   Chronic back pain    Diabetes mellitus without complication (HCC)    GERD (gastroesophageal reflux disease)    Hyperlipidemia    Hypertension    Left hydrocele    Lumbar stenosis    Wears glasses     Patient Active Problem List   Diagnosis Date Noted   HNP (herniated nucleus pulposus) with myelopathy, cervical 08/21/2020   HNP (herniated nucleus pulposus), lumbar 10/25/2019   Hydrocele, left 12/11/2013    Past Surgical History:  Procedure Laterality Date   ANTERIOR CERVICAL DECOMP/DISCECTOMY FUSION N/A 08/21/2020   Procedure: Anterior Cervical Decompression Discectomy and Fusion, Cervical four-five, Cervical five-six;  Surgeon: Onetha Kuba, MD;  Location: [facility] OR;  Service: Neurosurgery;  Laterality: N/A;   BACK SURGERY     CERVICAL FUSION  1994   C6 -- C7   EYE SURGERY Bilateral    cataract removal; 06/03/15, 07/29/15   HYDROCELE EXCISION Left 12/11/2013   Procedure: LEFT HYDROCELECTOMY ADULT;  Surgeon: Oneil JAYSON Rafter, MD;  Location[facility];  Service: Urology;  Laterality: Left;    LUMBAR LAMINECTOMY/DECOMPRESSION MICRODISCECTOMY Bilateral 10/25/2019   Procedure: Microdiscectomy - left - Lumbar two-three - right - Lumbar four-Lumbar five redo;  Surgeon: Onetha Kuba, MD;  Location: [facility] OR;  Service: Neurosurgery;  Laterality: Bilateral;   WRIST GANGLION EXCISION Left 2004       Home Medications    Prior to Admission medications   Medication Sig Start Date End Date Taking? Authorizing Provider  acetaminophen  (TYLENOL  8 HOUR ARTHRITIS PAIN) 650 MG CR tablet Take 1,300 mg by mouth every 8 (eight) hours as needed for pain.   Yes [provider]  enalapril  (VASOTEC ) 10 MG tablet Take 10 mg by mouth daily.   Yes [provider]  enalapril  (VASOTEC ) 2.5 MG tablet Take 2.5 mg by mouth daily. 08/09/24  Yes [provider]  famotidine  (PEPCID ) 20 MG tablet Take 20 mg by mouth daily.   Yes [provider]  fenofibrate 160 MG tablet Take 160 mg by mouth daily. 08/11/24  Yes [provider]  fexofenadine (ALLEGRA) 180 MG tablet Take 180 mg by mouth daily.   Yes [provider]  lovastatin (MEVACOR) 40 MG tablet Take 40 mg by mouth daily. 06/23/20  Yes [provider]  metFORMIN  (GLUCOPHAGE -XR) 750 MG 24 hr tablet Take 750 mg by mouth 2 (two) times daily. 08/17/24  Yes [provider]  OVER THE COUNTER MEDICATION Pt taking aller-flex 180 mg 1 a day   Yes [provider]  tamsulosin  (FLOMAX ) 0.4 MG CAPS capsule Take 0.4 mg by mouth daily.    Yes [provider]  tiZANidine (ZANAFLEX) 4 MG tablet Take 1 tablet (4 mg total) by mouth every 6 (six) hours as needed for muscle spasms. 10/07/24  Yes Coltan Spinello A, FNP  triamcinolone  (NASACORT  ALLERGY 24HR) 55 MCG/ACT AERO nasal inhaler Place 1 spray into the nose daily as needed (allergies).   Yes [provider]  diphenhydrAMINE  (BENADRYL ) 25 MG tablet Take 50 mg by mouth at bedtime.    [provider]  HYDROcodone -acetaminophen   (NORCO/VICODIN) 5-325 MG tablet Take 1 tablet by mouth every 4 (four) hours as needed for moderate pain ((score 4 to 6) not relieved by tramadol ). 08/22/20   Onetha Kuba, MD  metFORMIN  (GLUCOPHAGE -XR) 500 MG 24 hr tablet Take 500 mg by mouth 2 (two) times daily.  08/14/19   [provider]  traMADol  (ULTRAM ) 50 MG tablet Take 50 mg by mouth every 6 (six) hours as needed for moderate pain.    [provider]    Family History Family History  Adopted: Yes    Social History Social History   Tobacco Use   Smoking status: Former   Smokeless tobacco: Former    Types: Snuff    Quit date: 12/04/2008   Tobacco comments:    15 YR TOBACCO USE   Vaping Use   Vaping status: Never Used  Substance Use Topics   Alcohol use: Not Currently    Alcohol/week: 14.0 standard drinks of alcohol    Types: 14 Standard drinks or equivalent per week   Drug use: No     Allergies   Doxycycline, Vancomycin, and Bactrim [sulfamethoxazole-trimethoprim]   Review of Systems Review of Systems  Musculoskeletal:  Positive for neck pain.     Physical Exam Triage Vital Signs ED Triage Vitals  Encounter Vitals Group     BP 10/07/24 0929 122/72     Girls Systolic BP Percentile --      Girls Diastolic BP Percentile --      Boys Systolic BP Percentile --      Boys Diastolic BP Percentile --      Pulse Rate 10/07/24 0929 79     Resp 10/07/24 0929 16     Temp 10/07/24 0929 (!) 97.5 F (36.4 C)     Temp Source 10/07/24 0929 Oral     SpO2 10/07/24 0929 93 %     Weight 10/07/24 0927 220 lb (99.8 kg)     Height 10/07/24 0927 6' (1.829 m)     Head Circumference --      Peak Flow --      Pain Score 10/07/24 0926 3     Pain Loc --      Pain Education --      Exclude from Growth Chart --    No data found.  Updated Vital Signs BP 122/72 (BP Location: Right Arm)   Pulse 79   Temp (!) 97.5 F (36.4 C) (Oral)   Resp 16   Ht 6' (1.829 m)   Wt 220 lb (99.8 kg)   SpO2 93%   BMI 29.84 kg/m    Visual Acuity Right Eye Distance:   Left Eye Distance:   Bilateral Distance:    Right Eye Near:   Left Eye Near:    Bilateral Near:     Physical Exam Constitutional:  Appearance: Normal appearance.  Neck:   Pulmonary:     Effort: Pulmonary effort is normal.  Musculoskeletal:     Cervical back: Tenderness present. Pain with movement, spinous process tenderness and muscular tenderness present. Decreased range of motion.  Neurological:     Mental Status: He is alert.  Psychiatric:        Mood and Affect: Mood normal.      UC Treatments / Results  Labs (all labs ordered are listed, but only abnormal results are displayed) Labs Reviewed - No data to display  EKG   Radiology DG Cervical Spine Complete Result Date: 10/07/2024 EXAM: 6 VIEW(S) XRAY OF THE CERVICAL SPINE 10/07/2024 10:01:00 AM COMPARISON: MRI of the cervical spine 08/01/2020. CLINICAL HISTORY: neck pain, hx of cervical fusion. Pt states that he has had 2 neck surgeries in the past. Pt states that the pain in mostly on the left side. FINDINGS: BONES: Straightening of normal cervical lordosis, which may reflect patient positioning or muscle spasm. Anterior side plate and screw device is identified at C4 through C6 with interbody spacers in the C4-C5 and C5-C6 disc spaces with solid fusion of the C6-C7 disc space identified. The vertebral body heights and disc spaces are well preserved. There is no sign of acute fracture or subluxation. No aggressive appearing osseous lesion. DISCS AND DEGENERATIVE CHANGES: No severe degenerative changes. SOFT TISSUES: The prevertebral soft tissue space appears normal. Bilateral carotid artery calcifications. The visualized lungs appear clear. IMPRESSION: 1. No acute abnormality of the cervical spine. 2. Status post anterior cervical fusion at C4-C6 with interbody spacers at C4-5 and C5-6, and solid fusion at C6-7. Electronically signed by: Waddell Calk MD 10/07/2024 10:31 AM EDT RP  Workstation: [id number]    Procedures Procedures (including critical care time)  Medications Ordered in UC Medications - No data to display  Initial Impression / Assessment and Plan / UC Course  I have reviewed the triage vital signs and the nursing notes.  Pertinent labs & imaging results that were available during my care of the patient were reviewed by me and considered in my medical decision making (see chart for details).     Neck pain-x-ray without any specific concerns.  Hardware in place.  Noted to have a muscle spasm.  This is most likely the cause of his pain.  I have prescribed Zanaflex for muscle relaxation.  Recommend heat to the area and gentle massage. Follow-up with doctor in a week if not feeling better Final Clinical Impressions(s) / UC Diagnoses   Final diagnoses:  Neck pain  Muscle spasm     Discharge Instructions      I believe that you have a muscle spasm that is causing your neck pain.  Your x-ray was otherwise normal. I have prescribed some muscle laxer to use to see if this will help.  Recommend gentle heat to the area and massage. Follow-up as needed     ED Prescriptions     Medication Sig Dispense Auth. Provider   tiZANidine (ZANAFLEX) 4 MG tablet Take 1 tablet (4 mg total) by mouth every 6 (six) hours as needed for muscle spasms. 30 tablet Adah Wilbert LABOR, FNP      PDMP not reviewed this encounter.   Adah Wilbert LABOR, FNP 10/07/24 1050

## 2024-10-07 NOTE — ED Triage Notes (Signed)
 Pt states that he has had 2 neck surgeries in the past. Pt states that the pain in mostly on the left side.

## 2024-10-07 NOTE — Discharge Instructions (Signed)
 I believe that you have a muscle spasm that is causing your neck pain.  Your x-ray was otherwise normal. I have prescribed some muscle laxer to use to see if this will help.  Recommend gentle heat to the area and massage. Follow-up as needed

## 2024-10-13 DIAGNOSIS — Z23 Encounter for immunization: Secondary | ICD-10-CM | POA: Diagnosis not present

## 2024-10-13 DIAGNOSIS — M542 Cervicalgia: Secondary | ICD-10-CM | POA: Diagnosis not present

## 2024-10-13 DIAGNOSIS — Z6828 Body mass index (BMI) 28.0-28.9, adult: Secondary | ICD-10-CM | POA: Diagnosis not present
